# Patient Record
Sex: Female | Born: 1965 | Race: Black or African American | Hispanic: No | Marital: Single | Smoking: Never smoker
Health system: Southern US, Community
[De-identification: ages and names within clinical notes are randomized; demographics above are authoritative.]

## PROBLEM LIST (undated history)

## (undated) DIAGNOSIS — M1990 Unspecified osteoarthritis, unspecified site: Secondary | ICD-10-CM

## (undated) DIAGNOSIS — J45909 Unspecified asthma, uncomplicated: Secondary | ICD-10-CM

## (undated) DIAGNOSIS — R112 Nausea with vomiting, unspecified: Secondary | ICD-10-CM

## (undated) DIAGNOSIS — I10 Essential (primary) hypertension: Secondary | ICD-10-CM

## (undated) DIAGNOSIS — Z9889 Other specified postprocedural states: Secondary | ICD-10-CM

## (undated) HISTORY — PX: TUBAL LIGATION: SHX77

## (undated) HISTORY — PX: CHOLECYSTECTOMY: SHX55

## (undated) HISTORY — PX: ABDOMINAL HYSTERECTOMY: SHX81

## (undated) HISTORY — PX: OTHER SURGICAL HISTORY: SHX169

---

## 2013-04-08 ENCOUNTER — Emergency Department (HOSPITAL_COMMUNITY)
Admission: EM | Admit: 2013-04-08 | Discharge: 2013-04-08 | Disposition: A | Discharge status: Home / Self Care | Payer: BC Managed Care – PPO | Attending: Emergency Medicine | Admitting: Emergency Medicine

## 2013-04-08 ENCOUNTER — Encounter (HOSPITAL_COMMUNITY): Payer: Self-pay

## 2013-04-08 ENCOUNTER — Emergency Department (HOSPITAL_COMMUNITY): Payer: BC Managed Care – PPO

## 2013-04-08 DIAGNOSIS — Z79899 Other long term (current) drug therapy: Secondary | ICD-10-CM | POA: Insufficient documentation

## 2013-04-08 DIAGNOSIS — Z91040 Latex allergy status: Secondary | ICD-10-CM | POA: Insufficient documentation

## 2013-04-08 DIAGNOSIS — I10 Essential (primary) hypertension: Secondary | ICD-10-CM

## 2013-04-08 DIAGNOSIS — R51 Headache: Secondary | ICD-10-CM | POA: Insufficient documentation

## 2013-04-08 HISTORY — DX: Essential (primary) hypertension: I10

## 2013-04-08 LAB — CBC WITH DIFFERENTIAL/PLATELET
Basophils Absolute: 0 10*3/uL (ref 0.0–0.1)
Eosinophils Absolute: 0.1 10*3/uL (ref 0.0–0.7)
Eosinophils Relative: 3 % (ref 0–5)
MCH: 29.7 pg (ref 26.0–34.0)
MCV: 82.2 fL (ref 78.0–100.0)
Platelets: 294 10*3/uL (ref 150–400)
RDW: 12.6 % (ref 11.5–15.5)

## 2013-04-08 LAB — BASIC METABOLIC PANEL
Calcium: 10 mg/dL (ref 8.4–10.5)
Creatinine, Ser: 0.64 mg/dL (ref 0.50–1.10)
GFR calc non Af Amer: >90 mL/min (ref >90)
Glucose, Bld: 105 mg/dL — ABNORMAL HIGH (ref 70–99)
Sodium: 141 mEq/L (ref 135–145)

## 2013-04-08 LAB — POCT I-STAT TROPONIN I: Troponin i, poc: 0.01 ng/mL (ref 0.00–0.08)

## 2013-04-08 MED ORDER — ONDANSETRON HCL 4 MG/2ML IJ SOLN
INTRAMUSCULAR | Status: AC
  Administered 2013-04-08: 4 mg
  Filled 2013-04-08: qty 2

## 2013-04-08 MED ORDER — HYDROCHLOROTHIAZIDE 25 MG PO TABS: 25.0000 mg | ORAL_TABLET | Freq: Every day | ORAL | Status: DC

## 2013-04-08 NOTE — ED Notes (Addendum)
Per [facility], pt was at work and started having a HA. Pt has been out of her BP med, HCTZ, for a few weeks. BP 220/110 then 183/120. NSR on the monitor. 18g to RAC. Was given her dose of HCTZ at work.

## 2013-04-08 NOTE — ED Notes (Signed)
Pt states she vomited while using the rest room.

## 2013-04-08 NOTE — ED Provider Notes (Signed)
History     CSN: [id number]  Arrival date & time 04/08/13  5784   First MD Initiated Contact with Patient 04/08/13 [phone]      Chief Complaint  Patient presents with  . Hypertension    (Consider location/radiation/quality/duration/timing/severity/associated sxs/prior treatment) HPI Comments: Patient is a 47 year old female with a past medical history of hypertension who presents with a headache that started when the patient was at work this morning. Patient reports being out of her HCTZ for the past 3 weeks. She thinks her headache may be due to her hypertension. Patient reports having her BP checked at work, which was 220/110 at the time. Her headache was aching and severe. No aggravating/alleviating factors. No associated symptoms.    Past Medical History  Diagnosis Date  . Hypertension     Past Surgical History  Procedure Laterality Date  . Tubal ligation    . Abdominal hysterectomy      No family history on file.  History  Substance Use Topics  . Smoking status: Never Smoker   . Smokeless tobacco: Not on file  . Alcohol Use: Yes     Comment: occassionally    OB History   Grav Para Term Preterm Abortions TAB SAB Ect Mult Living                  Review of Systems  Constitutional:       Hypertension  All other systems reviewed and are negative.    Allergies  Latex  Home Medications   Current Outpatient Rx  Name  Route  Sig  Dispense  Refill  . hydrochlorothiazide (HYDRODIURIL) 25 MG tablet   Oral   Take 25 mg by mouth daily.         Marland Kitchen ibuprofen (ADVIL,MOTRIN) 200 MG tablet   Oral   Take 800 mg by mouth every 6 (six) hours as needed for pain.           BP 151/94  Pulse 55  Temp(Src) 98.3 F (36.8 C) (Oral)  Resp 11  SpO2 89%  Physical Exam  Nursing note and vitals reviewed. Constitutional: She is oriented to person, place, and time. She appears well-developed and well-nourished. No distress.  HENT:  Head: Normocephalic and atraumatic.   Eyes: Conjunctivae and EOM are normal. Pupils are equal, round, and reactive to light.  Neck: Normal range of motion. Neck supple.  Cardiovascular: Normal rate and regular rhythm.  Exam reveals no gallop and no friction rub.   No murmur heard. Pulmonary/Chest: Effort normal and breath sounds normal. She has no wheezes. She has no rales. She exhibits no tenderness.  Abdominal: Soft. She exhibits no distension. There is no tenderness. There is no rebound and no guarding.  Musculoskeletal: Normal range of motion.  Neurological: She is alert and oriented to person, place, and time. Coordination normal.  Speech is goal-oriented. Moves limbs without ataxia.   Skin: Skin is warm and dry.  Psychiatric: She has a normal mood and affect. Her behavior is normal.    ED Course  Procedures (including critical care time)   Date: 04/08/2013  Rate: 59  Rhythm: normal sinus rhythm  QRS Axis: normal  Intervals: normal  ST/T Wave abnormalities: normal  Conduction Disutrbances:none  Narrative Interpretation: NSR  Old EKG Reviewed: none available    Labs Reviewed  CBC WITH DIFFERENTIAL - Abnormal; Notable for the following:    MCHC 36.1 (*)    All other components within normal limits  BASIC METABOLIC PANEL -  Abnormal; Notable for the following:    Potassium 3.2 (*)    Glucose, Bld 105 (*)    All other components within normal limits  POCT I-STAT TROPONIN I   Dg Chest 2 View  04/08/2013  *RADIOLOGY REPORT*  Clinical Data: Left-sided chest pain.  CHEST - 2 VIEW  Comparison: None.  Findings: The heart size is normal.  The lungs are clear.  The visualized soft tissues and bony thorax are unremarkable.  IMPRESSION: Negative two-view chest.   Original Report Authenticated By: Marin Roberts, M.D.      1. Hypertension       MDM  12:03 PM Labs unremarkable.   1:47 PM Chest xray unremarkable. Patient's BP is improving since she took her HCTZ that was given to her at work. Vitals stable  and patient is afebrile. Patient will be discharged without further evaluation. I will refill her HCTZ.        Emilia Beck, PA-C 04/08/13 1356

## 2013-04-08 NOTE — ED Notes (Addendum)
Pt starting to have cp on left side of chest. PA made aware

## 2013-04-08 NOTE — ED Notes (Signed)
Kaitlyn, PA back in with patient.

## 2013-04-09 NOTE — ED Provider Notes (Signed)
Medical screening examination/treatment/procedure(s) were performed by non-physician practitioner and as supervising physician I was immediately available for consultation/collaboration.   Shantale Holtmeyer L Brie Eppard, MD 04/09/13 1148 

## 2015-10-24 ENCOUNTER — Emergency Department (HOSPITAL_COMMUNITY): Payer: BLUE CROSS/BLUE SHIELD

## 2015-10-24 ENCOUNTER — Encounter (HOSPITAL_COMMUNITY): Payer: Self-pay | Admitting: Emergency Medicine

## 2015-10-24 ENCOUNTER — Inpatient Hospital Stay (HOSPITAL_COMMUNITY)
Admission: EM | Admit: 2015-10-24 | Discharge: 2015-10-28 | DRG: 203 | Disposition: A | Discharge status: Home / Self Care | Payer: BLUE CROSS/BLUE SHIELD | Attending: Internal Medicine | Admitting: Internal Medicine

## 2015-10-24 DIAGNOSIS — D72828 Other elevated white blood cell count: Secondary | ICD-10-CM | POA: Diagnosis present

## 2015-10-24 DIAGNOSIS — R000 Tachycardia, unspecified: Secondary | ICD-10-CM | POA: Diagnosis present

## 2015-10-24 DIAGNOSIS — Z91040 Latex allergy status: Secondary | ICD-10-CM

## 2015-10-24 DIAGNOSIS — J209 Acute bronchitis, unspecified: Secondary | ICD-10-CM | POA: Diagnosis present

## 2015-10-24 DIAGNOSIS — E876 Hypokalemia: Secondary | ICD-10-CM | POA: Diagnosis present

## 2015-10-24 DIAGNOSIS — I10 Essential (primary) hypertension: Secondary | ICD-10-CM | POA: Diagnosis present

## 2015-10-24 DIAGNOSIS — Z888 Allergy status to other drugs, medicaments and biological substances status: Secondary | ICD-10-CM

## 2015-10-24 DIAGNOSIS — R0602 Shortness of breath: Secondary | ICD-10-CM

## 2015-10-24 DIAGNOSIS — D72829 Elevated white blood cell count, unspecified: Secondary | ICD-10-CM | POA: Diagnosis not present

## 2015-10-24 DIAGNOSIS — T380X5A Adverse effect of glucocorticoids and synthetic analogues, initial encounter: Secondary | ICD-10-CM | POA: Diagnosis present

## 2015-10-24 DIAGNOSIS — J029 Acute pharyngitis, unspecified: Secondary | ICD-10-CM | POA: Diagnosis present

## 2015-10-24 DIAGNOSIS — J40 Bronchitis, not specified as acute or chronic: Secondary | ICD-10-CM

## 2015-10-24 HISTORY — DX: Essential (primary) hypertension: I10

## 2015-10-24 LAB — BASIC METABOLIC PANEL
ANION GAP: 10 (ref 5–15)
Anion gap: 16 — ABNORMAL HIGH (ref 5–15)
BUN: 13 mg/dL (ref 6–20)
BUN: 10 mg/dL (ref 6–20)
CALCIUM: 9.9 mg/dL (ref 8.9–10.3)
CO2: 27 mmol/L (ref 22–32)
CO2: 18 mmol/L — AB (ref 22–32)
CREATININE: 0.88 mg/dL (ref 0.44–1.00)
Calcium: 9 mg/dL (ref 8.9–10.3)
Chloride: 102 mmol/L (ref 101–111)
Chloride: 106 mmol/L (ref 101–111)
Creatinine, Ser: 0.84 mg/dL (ref 0.44–1.00)
GFR calc Af Amer: >60 mL/min (ref >60)
GFR calc non Af Amer: >60 mL/min (ref >60)
GFR calc non Af Amer: >60 mL/min (ref >60)
GLUCOSE: 216 mg/dL — AB (ref 65–99)
Glucose, Bld: 130 mg/dL — ABNORMAL HIGH (ref 65–99)
Potassium: <2 mmol/L — CL (ref 3.5–5.1)
Potassium: 2.5 mmol/L — CL (ref 3.5–5.1)
Sodium: 139 mmol/L (ref 135–145)
Sodium: 140 mmol/L (ref 135–145)

## 2015-10-24 LAB — CBC WITH DIFFERENTIAL/PLATELET
BASOS ABS: 0 10*3/uL (ref 0.0–0.1)
BASOS PCT: 0 %
EOS ABS: 0.2 10*3/uL (ref 0.0–0.7)
EOS PCT: 2 %
HCT: 35.9 % — ABNORMAL LOW (ref 36.0–46.0)
Hemoglobin: 12.6 g/dL (ref 12.0–15.0)
LYMPHS PCT: 46 %
Lymphs Abs: 3.5 10*3/uL (ref 0.7–4.0)
MCH: 29 pg (ref 26.0–34.0)
MCHC: 35.1 g/dL (ref 30.0–36.0)
MCV: 82.7 fL (ref 78.0–100.0)
MONO ABS: 0.4 10*3/uL (ref 0.1–1.0)
Monocytes Relative: 5 %
Neutro Abs: 3.6 10*3/uL (ref 1.7–7.7)
Neutrophils Relative %: 47 %
PLATELETS: 367 10*3/uL (ref 150–400)
RBC: 4.34 MIL/uL (ref 3.87–5.11)
RDW: 12.9 % (ref 11.5–15.5)
WBC: 7.7 10*3/uL (ref 4.0–10.5)

## 2015-10-24 LAB — BLOOD GAS, ARTERIAL
ACID-BASE DEFICIT: 4.5 mmol/L — AB (ref 0.0–2.0)
Bicarbonate: 18.2 mEq/L — ABNORMAL LOW (ref 20.0–24.0)
DRAWN BY: 307971
FIO2: 0.21
O2 SAT: 96.3 %
PATIENT TEMPERATURE: 98.6
PCO2 ART: 27.5 mmHg — AB (ref 35.0–45.0)
TCO2: 16.3 mmol/L (ref 0–100)
pH, Arterial: 7.436 (ref 7.350–7.450)
pO2, Arterial: 86.2 mmHg (ref 80.0–100.0)

## 2015-10-24 LAB — INFLUENZA PANEL BY PCR (TYPE A & B)
H1N1 flu by pcr: NOT DETECTED
INFLAPCR: NEGATIVE
INFLBPCR: NEGATIVE

## 2015-10-24 LAB — I-STAT TROPONIN, ED: TROPONIN I, POC: 0 ng/mL (ref 0.00–0.08)

## 2015-10-24 LAB — MAGNESIUM: Magnesium: 1.7 mg/dL (ref 1.7–2.4)

## 2015-10-24 MED ORDER — GUAIFENESIN ER 600 MG PO TB12
1200.0000 mg | ORAL_TABLET | Freq: Two times a day (BID) | ORAL | Status: DC
  Administered 2015-10-24 – 2015-10-26 (×4): 1200 mg via ORAL
  Filled 2015-10-24 (×4): qty 2

## 2015-10-24 MED ORDER — IPRATROPIUM BROMIDE 0.02 % IN SOLN
0.5000 mg | Freq: Four times a day (QID) | RESPIRATORY_TRACT | Status: DC
  Administered 2015-10-24 – 2015-10-28 (×15): 0.5 mg via RESPIRATORY_TRACT
  Filled 2015-10-24 (×15): qty 2.5

## 2015-10-24 MED ORDER — POTASSIUM CHLORIDE CRYS ER 20 MEQ PO TBCR
40.0000 meq | EXTENDED_RELEASE_TABLET | ORAL | Status: AC
  Administered 2015-10-24 (×3): 40 meq via ORAL
  Filled 2015-10-24 (×3): qty 2

## 2015-10-24 MED ORDER — ENOXAPARIN SODIUM 40 MG/0.4ML ~~LOC~~ SOLN
40.0000 mg | SUBCUTANEOUS | Status: DC
  Administered 2015-10-24 – 2015-10-27 (×4): 40 mg via SUBCUTANEOUS
  Filled 2015-10-24 (×4): qty 0.4

## 2015-10-24 MED ORDER — METHYLPREDNISOLONE SODIUM SUCC 125 MG IJ SOLR
80.0000 mg | Freq: Four times a day (QID) | INTRAMUSCULAR | Status: DC
  Administered 2015-10-24 – 2015-10-25 (×3): 80 mg via INTRAVENOUS
  Filled 2015-10-24 (×3): qty 2

## 2015-10-24 MED ORDER — HYDROXYZINE HCL 25 MG PO TABS: 25.0000 mg | ORAL_TABLET | Freq: Three times a day (TID) | ORAL | Status: DC | PRN

## 2015-10-24 MED ORDER — ACETAMINOPHEN 325 MG PO TABS: 650.0000 mg | ORAL_TABLET | Freq: Four times a day (QID) | ORAL | Status: DC | PRN

## 2015-10-24 MED ORDER — ONDANSETRON HCL 4 MG/2ML IJ SOLN: 4.0000 mg | Freq: Four times a day (QID) | INTRAMUSCULAR | Status: DC | PRN

## 2015-10-24 MED ORDER — ALUM & MAG HYDROXIDE-SIMETH 200-200-20 MG/5ML PO SUSP: 30.0000 mL | Freq: Four times a day (QID) | ORAL | Status: DC | PRN

## 2015-10-24 MED ORDER — METHYLPREDNISOLONE SODIUM SUCC 125 MG IJ SOLR
125.0000 mg | Freq: Once | INTRAMUSCULAR | Status: AC
  Administered 2015-10-24: 125 mg via INTRAVENOUS
  Filled 2015-10-24: qty 2

## 2015-10-24 MED ORDER — IPRATROPIUM BROMIDE 0.02 % IN SOLN
0.5000 mg | Freq: Once | RESPIRATORY_TRACT | Status: AC
  Administered 2015-10-24: 0.5 mg via RESPIRATORY_TRACT
  Filled 2015-10-24: qty 2.5

## 2015-10-24 MED ORDER — SORBITOL 70 % SOLN
30.0000 mL | Freq: Every day | Status: DC | PRN
Start: 2015-10-24 — End: 2015-10-28

## 2015-10-24 MED ORDER — ACETAMINOPHEN 650 MG RE SUPP
650.0000 mg | Freq: Four times a day (QID) | RECTAL | Status: DC | PRN
Start: 2015-10-24 — End: 2015-10-28

## 2015-10-24 MED ORDER — GUAIFENESIN-DM 100-10 MG/5ML PO SYRP
5.0000 mL | ORAL_SOLUTION | ORAL | Status: DC | PRN
  Administered 2015-10-24 – 2015-10-26 (×5): 5 mL via ORAL
  Filled 2015-10-24 (×5): qty 10

## 2015-10-24 MED ORDER — POTASSIUM CHLORIDE CRYS ER 20 MEQ PO TBCR
40.0000 meq | EXTENDED_RELEASE_TABLET | ORAL | Status: AC
  Administered 2015-10-25: 40 meq via ORAL
  Filled 2015-10-24 (×2): qty 2

## 2015-10-24 MED ORDER — SODIUM CHLORIDE 0.9 % IJ SOLN
3.0000 mL | Freq: Two times a day (BID) | INTRAMUSCULAR | Status: DC
  Administered 2015-10-25 – 2015-10-26 (×2): 3 mL via INTRAVENOUS

## 2015-10-24 MED ORDER — SENNOSIDES-DOCUSATE SODIUM 8.6-50 MG PO TABS: 1.0000 | ORAL_TABLET | Freq: Every evening | ORAL | Status: DC | PRN

## 2015-10-24 MED ORDER — MAGNESIUM SULFATE 4 GM/100ML IV SOLN
4.0000 g | Freq: Once | INTRAVENOUS | Status: AC
  Administered 2015-10-24: 4 g via INTRAVENOUS
  Filled 2015-10-24: qty 100

## 2015-10-24 MED ORDER — SODIUM CHLORIDE 0.9 % IV BOLUS (SEPSIS)
1000.0000 mL | Freq: Once | INTRAVENOUS | Status: AC
  Administered 2015-10-24: 1000 mL via INTRAVENOUS

## 2015-10-24 MED ORDER — IPRATROPIUM BROMIDE 0.02 % IN SOLN: 0.5000 mg | RESPIRATORY_TRACT | Status: DC | PRN

## 2015-10-24 MED ORDER — SODIUM CHLORIDE 0.9 % IV SOLN
INTRAVENOUS | Status: DC
  Administered 2015-10-24 – 2015-10-25 (×2): via INTRAVENOUS
  Filled 2015-10-24 (×4): qty 1000

## 2015-10-24 MED ORDER — LEVALBUTEROL HCL 0.63 MG/3ML IN NEBU: 0.6300 mg | INHALATION_SOLUTION | RESPIRATORY_TRACT | Status: DC | PRN

## 2015-10-24 MED ORDER — OXYCODONE HCL 5 MG PO TABS
5.0000 mg | ORAL_TABLET | ORAL | Status: DC | PRN
  Administered 2015-10-25 – 2015-10-27 (×5): 5 mg via ORAL
  Filled 2015-10-24 (×5): qty 1

## 2015-10-24 MED ORDER — ALBUTEROL SULFATE (2.5 MG/3ML) 0.083% IN NEBU
5.0000 mg | INHALATION_SOLUTION | Freq: Once | RESPIRATORY_TRACT | Status: AC
  Administered 2015-10-24: 5 mg via RESPIRATORY_TRACT
  Filled 2015-10-24: qty 6

## 2015-10-24 MED ORDER — POTASSIUM CHLORIDE CRYS ER 20 MEQ PO TBCR
40.0000 meq | EXTENDED_RELEASE_TABLET | Freq: Once | ORAL | Status: AC
  Administered 2015-10-24: 40 meq via ORAL
  Filled 2015-10-24: qty 2

## 2015-10-24 MED ORDER — LEVOFLOXACIN IN D5W 500 MG/100ML IV SOLN
500.0000 mg | INTRAVENOUS | Status: DC
  Administered 2015-10-24 – 2015-10-26 (×3): 500 mg via INTRAVENOUS
  Filled 2015-10-24 (×3): qty 100

## 2015-10-24 MED ORDER — OSELTAMIVIR PHOSPHATE 75 MG PO CAPS
75.0000 mg | ORAL_CAPSULE | Freq: Two times a day (BID) | ORAL | Status: DC
  Administered 2015-10-24 – 2015-10-25 (×2): 75 mg via ORAL
  Filled 2015-10-24 (×3): qty 1

## 2015-10-24 MED ORDER — AMLODIPINE BESYLATE 5 MG PO TABS
5.0000 mg | ORAL_TABLET | Freq: Every day | ORAL | Status: DC
Start: 2015-10-25 — End: 2015-10-28
  Administered 2015-10-25 – 2015-10-28 (×4): 5 mg via ORAL
  Filled 2015-10-24 (×4): qty 1

## 2015-10-24 MED ORDER — POTASSIUM CHLORIDE CRYS ER 20 MEQ PO TBCR: 40.0000 meq | EXTENDED_RELEASE_TABLET | ORAL | Status: DC

## 2015-10-24 MED ORDER — LEVALBUTEROL HCL 0.63 MG/3ML IN NEBU
0.6300 mg | INHALATION_SOLUTION | Freq: Four times a day (QID) | RESPIRATORY_TRACT | Status: DC
  Administered 2015-10-24 – 2015-10-28 (×15): 0.63 mg via RESPIRATORY_TRACT
  Filled 2015-10-24 (×15): qty 3

## 2015-10-24 MED ORDER — ALBUTEROL (5 MG/ML) CONTINUOUS INHALATION SOLN
10.0000 mg/h | INHALATION_SOLUTION | RESPIRATORY_TRACT | Status: DC
  Administered 2015-10-24: 10 mg/h via RESPIRATORY_TRACT
  Filled 2015-10-24: qty 20

## 2015-10-24 MED ORDER — POTASSIUM CHLORIDE 10 MEQ/100ML IV SOLN
10.0000 meq | INTRAVENOUS | Status: AC
  Administered 2015-10-24 (×2): 10 meq via INTRAVENOUS
  Filled 2015-10-24 (×2): qty 100

## 2015-10-24 MED ORDER — ONDANSETRON HCL 4 MG PO TABS: 4.0000 mg | ORAL_TABLET | Freq: Four times a day (QID) | ORAL | Status: DC | PRN

## 2015-10-24 MED ORDER — MAGNESIUM SULFATE 2 GM/50ML IV SOLN
2.0000 g | Freq: Once | INTRAVENOUS | Status: DC
  Filled 2015-10-24: qty 50

## 2015-10-24 MED ORDER — HYDROXYZINE HCL 50 MG/ML IM SOLN: 25.0000 mg | Freq: Four times a day (QID) | INTRAMUSCULAR | Status: DC | PRN

## 2015-10-24 NOTE — ED Notes (Signed)
Notified PA and edp results from [facility] 8

## 2015-10-24 NOTE — H&P (Addendum)
[facility] History and Physical  Madilyne Tadlock ZOX:[id number] DOB: 1966-11-17 DOA: 10/24/2015  Referring physician: [facility]  PCP: No PCP Per Patient   Chief Complaint: SOB  HPI: Codie Hainer is a 49 y.o. 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 Patient denies any fevers, no diarrhea, no constipation, no weakness, no melena, no myalgias, no hematemesis, no hematochezia. Patient stated that did not get the flu shot as she did have a reaction to it before in the past. Patient denies any recent long car ride, no recent surgeries. Patient's niece stated that patient recently started taking a diet pill which patient can't quite remember the name of the pill.  Patient was seen in the ED and noted to have worsening shortness of breath, inspiratory and expiratory wheezing, difficulty speaking, with a hoarse voice. Patient was given some continuous nebulizer treatments and IV steroids with slight improvement in his symptoms. Chest x-ray done was negative for any acute infiltrate. Basic metabolic profile done had a potassium of less than 2. Magnesium was 1.7. CBC was unremarkable. EKG showed a sinus tachycardia. [facility] were called to admit the patient for further evaluation and management.   Review of Systems: As per history of present illness otherwise negative. Constitutional:  No weight loss, night sweats, Fevers, chills, fatigue.  HEENT:  No headaches, Difficulty swallowing,Tooth/dental problems,Sore throat,  No sneezing, itching, ear ache, nasal congestion, post nasal drip,  Cardio-vascular:  No chest pain, Orthopnea, PND, swelling in lower extremities,  anasarca, dizziness, palpitations  GI:  No heartburn, indigestion, abdominal pain, nausea, vomiting, diarrhea, change in bowel habits, loss of appetite  Resp:  No shortness of breath with exertion or at rest. No excess mucus, no productive cough, No non-productive cough, No coughing up of blood.No change in color of mucus.No wheezing.No chest wall deformity  Skin:  no rash or lesions.  GU:  no dysuria, change in color of urine, no urgency or frequency. No flank pain.  Musculoskeletal:  No joint pain or swelling. No decreased range of motion. No back pain.  Psych:  No change in mood or affect. No depression or anxiety. No memory loss.   Past Medical History  Diagnosis Date  . Hypertension   . HTN (hypertension) 10/24/2015   Past Surgical History  Procedure Laterality Date  . Tubal ligation    . Abdominal hysterectomy     Social History:  reports that she has never smoked. She does not have any smokeless tobacco history on file. She reports that she drinks alcohol. She reports that she does not use illicit drugs.  Allergies  Allergen Reactions  . Ace Inhibitors Cough  . Latex Rash    History reviewed. No pertinent family history. mother deceased when patient was 56 months old and a such cause of death unknown. Father deceased when patient was 5 years old and patient thinks he may have died from lung cancer.  Prior to Admission medications   Medication Sig Start Date End Date Taking? Authorizing Provider  amLODipine (NORVASC) 5 MG tablet Take 5 mg by mouth daily. 10/14/15  Yes Historical Provider, MD  azithromycin (ZITHROMAX) 250 MG tablet Take 250-500 mg by mouth daily. Take 2 tablets immediately, and 1 tablet daily  for 5 days. 10/18/15  Yes Historical Provider, MD  ibuprofen (ADVIL,MOTRIN) 200 MG tablet Take 800 mg by mouth every 6 (six) hours as needed for pain.   Yes Historical Provider, MD  losartan-hydrochlorothiazide (HYZAAR) 100-25 MG tablet Take 1 tablet by mouth daily.  10/14/15  Yes Historical Provider, MD  Multiple Vitamins-Minerals (MULTIVITAMIN ADULT) TABS Take 1 tablet by mouth daily.   Yes Historical Provider, MD  vitamin E (VITAMIN E) 400 UNIT capsule Take 400 Units by mouth daily.   Yes Historical Provider, MD   Physical Exam: Filed Vitals:   10/24/15 1200 10/24/15 1230 10/24/15 1231 10/24/15 1359  BP: 147/90   125/76  Pulse: 90 98  105  Temp:      TempSrc:      Resp: 20 22  24   Height:      Weight:      SpO2: 100% 100% 99% 100%    Wt Readings from Last 3 Encounters:  10/24/15 78.019 kg (172 lb)    General:  Well-developed well-nourished laying on gurney speaking in short choppy sentences in no acute cardiopulmonary distress. Voice is hoarse. Eyes: PERRLA, EOMI, normal lids, irises & conjunctiva ENT: grossly normal hearing, lips & tongue Neck: no LAD, masses or thyromegaly Cardiovascular: tachycardic, no m/r/g. No LE edema. Telemetry:  sinus tachycardia  Respiratory:  diffuse coarse breath sounds with expiratory wheezing. Speaking in short choppy sentences. Poor air movement. No crackles.  Abdomen: soft, ntnd, positive bowel sounds, no rebound, no guarding  Skin: no rash or induration seen on limited exam Musculoskeletal: grossly normal tone BUE/BLE Psychiatric: grossly normal mood and affect, speech fluent and appropriate Neurologic:  alert and oriented 3. Cranial nerves II through XII are grossly intact. No focal deficits. Sensation is intact. Visual fields are intact. Gait not tested secondary to safety.           Labs on Admission:  Basic Metabolic Panel:  Recent Labs Lab 10/24/15 1214  NA 139  K <2.0*  CL 102  CO2 27  GLUCOSE 130*  BUN 13  CREATININE 0.84  CALCIUM 9.9  MG 1.7   Liver Function Tests: No results for input(s): AST, ALT, ALKPHOS, BILITOT, PROT, ALBUMIN in the last 168 hours. No results for input(s): LIPASE, AMYLASE in the last 168 hours. No results for input(s): AMMONIA in the last 168  hours. CBC:  Recent Labs Lab 10/24/15 1157  WBC 7.7  NEUTROABS 3.6  HGB 12.6  HCT 35.9*  MCV 82.7  PLT 367   Cardiac Enzymes: No results for input(s): CKTOTAL, CKMB, CKMBINDEX, TROPONINI in the last 168 hours.  BNP (last 3 results) No results for input(s): BNP in the last 8760 hours.  ProBNP (last 3 results) No results for input(s): PROBNP in the last 8760 hours.  CBG: No results for input(s): GLUCAP in the last 168 hours.  Radiological Exams on Admission: Dg Chest 2 View  10/24/2015  CLINICAL DATA:  Shortness of breath beginning this morning. Productive cough. Patient diagnosed with bronchitis 10/18/2015. Initial encounter. EXAM: CHEST  2 VIEW COMPARISON:  PA and lateral chest 04/08/2013. FINDINGS: Heart size and mediastinal contours are within normal limits. Both lungs are clear. Visualized skeletal structures are unremarkable. IMPRESSION: Negative exam. Electronically Signed   By: Drusilla Kannerhomas  Dalessio M.D.   On: 10/24/2015 13:26    EKG: Independently reviewed. sinus tachycardia.  Assessment/Plan Principal Problem:   Acute bronchitis with bronchospasm Active Problems:   Acute hypokalemia   HTN (hypertension)   Sinus tachycardia (HCC)  Hypokalemia   #1 acute bronchitis with bronchospasms patient presenting with upper respiratory symptoms with a productive cough with wheezing and worsening shortness of breath. Patient recently diagnosed with a bronchitis. Chest x-ray was negative for any acute infiltrate. Repeat chest x-ray in the morning. Check Influenza PCR. Empiric Tamiflu. Will admit patient. Placed on IV steroids, IV Levaquin, oxygen, scheduled nebulizers, Mucinex.  #2 acute severe hypokalemia Questionable etiology. Patient on diuretics. Patient also noted to have been receiving albuterol. Patient also noted to have recently started a diet pill which she can't quite remember the name of the pill. Magnesium level was at 1.7. Will give a dose of IV magnesium to keep  magnesium greater than 2. We'll place on IV potassium runs, oral potassium, IV fluids with potassium. Serial B meds. Follow.  #3 sinus tachycardia Likely secondary to problem #1 and albuterol. Patient with no significant chest pain. EKG with no ischemic changes. Check a TSH. Place on IV fluids. Treat for problems #1 and 2.  #4 hypertension Continue home regimen Norvasc. Hold Hyzaar for now. If better blood pressure control is needed may increase Norvasc and start losartan.  #5 prophylaxis Lovenox for DVT prophylaxis.   Code Status: Full DVT Prophylaxis: Lovenox Family Communication: updated patient and niece at bedside. Disposition Plan: Admit to telemetry.  Time spent: 65 mins  [facility] MD [facility] Pager [phone]

## 2015-10-24 NOTE — ED Notes (Signed)
Pt can go to floor at 14;10 

## 2015-10-24 NOTE — ED Notes (Signed)
PA/RN notified of abnormal lab 

## 2015-10-24 NOTE — ED Notes (Signed)
Respiratory therapy at bedside.

## 2015-10-24 NOTE — ED Notes (Signed)
Respiratory made aware of neb treatment ordered.  

## 2015-10-24 NOTE — ED Provider Notes (Signed)
CSN: [id number]     Arrival date & time 10/24/15  1038 History   First MD Initiated Contact with Patient 10/24/15 1047     Chief Complaint  Patient presents with  . Shortness of Breath     (Consider location/radiation/quality/duration/timing/severity/associated sxs/prior Treatment) HPI Doris Jones is a 49 y.o. female with history of hypertension, presents to emergency department complaining of shortness of breath. Patient states that she has been coughing and having upper respiratory problems for 2 weeks. She was seen by primary care doctor one week ago and diagnosed with bronchitis. She was treated with azithromycin, and she states they gave her 2 shots in the office. She states she has been taking azithromycin but admits to missing "a dose or 2." She denies using any inhalers. She denies using any other medications. States today patient was at work, developed worsening shortness of breath, wheezing, worsening cough, difficulty speaking, was transported to the emergency department. Patient was given 5 mg of albuterol in formalin is of Zofran IV in route. Patient states that the breathing treatment has helped her some but continues to have shortness of breath and difficulty speaking.  Past Medical History  Diagnosis Date  . Hypertension    Past Surgical History  Procedure Laterality Date  . Tubal ligation    . Abdominal hysterectomy     No family history on file. Social History  Substance Use Topics  . Smoking status: Never Smoker   . Smokeless tobacco: None  . Alcohol Use: Yes     Comment: occassionally   OB History    No data available     Review of Systems  Constitutional: Negative for fever and chills.  Respiratory: Positive for cough, chest tightness and shortness of breath.   Cardiovascular: Positive for chest pain. Negative for palpitations and leg swelling.  Gastrointestinal: Positive for nausea. Negative for vomiting, abdominal pain and diarrhea.  Musculoskeletal:  Negative for myalgias, arthralgias, neck pain and neck stiffness.  Skin: Negative for rash.  Neurological: Negative for dizziness, weakness and headaches.  All other systems reviewed and are negative.     Allergies  Ace inhibitors and Latex  Home Medications   Prior to Admission medications   Medication Sig Start Date End Date Taking? Authorizing Provider  amLODipine (NORVASC) 5 MG tablet Take 5 mg by mouth daily. 10/14/15  Yes Historical Provider, MD  azithromycin (ZITHROMAX) 250 MG tablet Take 250-500 mg by mouth daily. Take 2 tablets immediately, and 1 tablet daily for 5 days. 10/18/15  Yes Historical Provider, MD  ibuprofen (ADVIL,MOTRIN) 200 MG tablet Take 800 mg by mouth every 6 (six) hours as needed for pain.   Yes Historical Provider, MD  losartan-hydrochlorothiazide (HYZAAR) 100-25 MG tablet Take 1 tablet by mouth daily. 10/14/15  Yes Historical Provider, MD  Multiple Vitamins-Minerals (MULTIVITAMIN ADULT) TABS Take 1 tablet by mouth daily.   Yes Historical Provider, MD  vitamin E (VITAMIN E) 400 UNIT capsule Take 400 Units by mouth daily.   Yes Historical Provider, MD  hydrochlorothiazide (HYDRODIURIL) 25 MG tablet Take 1 tablet (25 mg total) by mouth daily. Patient not taking: Reported on 10/24/2015 04/08/13   Kaitlyn Szekalski, PA-C   BP 167/86 mmHg  Pulse 101  Temp(Src) 97.7 F (36.5 C) (Oral)  Resp 17  Ht  (1.626 m)  Wt 172 lb (78.019 kg)  BMI 29.51 kg/m2  SpO2 100% Physical Exam  Constitutional: She is oriented to person, place, and time. She appears well-developed and well-nourished. No distress.  HENT:  Head: Normocephalic.  Eyes: Conjunctivae are normal.  Neck: Neck supple.  Cardiovascular: Normal rate, regular rhythm and normal heart sounds.   Pulmonary/Chest: Effort normal. She has wheezes. She has no rales.  Inspiratory and expiratory wheezes. Patient's voice is hoarse. She is tachypneic. Speaking 3-4 word sentences and time.  Abdominal: Soft. Bowel  sounds are normal. She exhibits no distension. There is no tenderness. There is no rebound.  Musculoskeletal: She exhibits no edema.  Neurological: She is alert and oriented to person, place, and time.  Skin: Skin is warm and dry.  Psychiatric: She has a normal mood and affect. Her behavior is normal.  Nursing note and vitals reviewed.   ED Course  Procedures (including critical care time) Labs Review Labs Reviewed  CBC WITH DIFFERENTIAL/PLATELET - Abnormal; Notable for the following:    HCT 35.9 (*)    All other components within normal limits  BASIC METABOLIC PANEL - Abnormal; Notable for the following:    Potassium <2.0 (*)    Glucose, Bld 130 (*)    All other components within normal limits  BLOOD GAS, ARTERIAL - Abnormal; Notable for the following:    pCO2 arterial 27.5 (*)    Bicarbonate 18.2 (*)    Acid-base deficit 4.5 (*)    All other components within normal limits  URINE CULTURE  MAGNESIUM  INFLUENZA PANEL BY PCR (TYPE A & B, H1N1)  TSH  URINALYSIS, ROUTINE W REFLEX MICROSCOPIC (NOT AT [facility])  BASIC METABOLIC PANEL  CBC  I-STAT CHEM 8, ED  I-STAT TROPOININ, ED    Imaging Review Dg Chest 2 View  10/24/2015  CLINICAL DATA:  Shortness of breath beginning this morning. Productive cough. Patient diagnosed with bronchitis 10/18/2015. Initial encounter. EXAM: CHEST  2 VIEW COMPARISON:  PA and lateral chest 04/08/2013. FINDINGS: Heart size and mediastinal contours are within normal limits. Both lungs are clear. Visualized skeletal structures are unremarkable. IMPRESSION: Negative exam. Electronically Signed   By: Drusilla Kanner M.D.   On: 10/24/2015 13:26   I have personally reviewed and evaluated these images and lab results as part of my medical decision-making.   EKG Interpretation   Date/Time:  Monday October 24 2015 10:58:29 EST Ventricular Rate:  102 PR Interval:  145 QRS Duration: 90 QT Interval:  392 QTC Calculation: 511 R Axis:   31 Text  Interpretation:  Sinus tachycardia Borderline low voltage, extremity  leads Minimal ST depression, lateral leads Prolonged QT interval , new  since last tracing Since last tracing rate faster Confirmed by KNAPP   MD-J, JON ([id number]) on 10/24/2015 11:13:55 AM      MDM   Final diagnoses:  Hypokalemia  Bronchitis    11:42 AM Pt seen and examined. Pt with cough, wheezing, tachypnea. Will give another breathing tx. CXR ordered. Labs pending. No CP. Will add ecg. Doubt PE, no risk factors.   The patient continues to have shortness of breath after breathing treatment. Will start on continuous neb. Patient's potassium is less than 2. Repeated to verify. Magnesium is normal. Actually on magnesium for her shortness of breath as well. Will give potassium. 40 mEq given by mouth, will run 3 runs of 10 mEq IV. EKG showing mildly prolonged QT, otherwise with no acute findings.  Patient shortness of breath improved. She continues to be tachypneic however ambulated to the bathroom back with no problems. She still coughing. She will need frequent neb treatments and close monitoring. Spoke with tried hospitalist who will admit her, potassium still running.  Filed Vitals:   10/24/15 1359 10/24/15 1400 10/24/15 1445 10/24/15 1645  BP: 125/76 119/63  128/67  Pulse: 105 96 112 113  Temp:    98.4 F (36.9 C)  TempSrc:    Oral  Resp: 24 22 20 20   Height:    5\' 1"  (1.549 m)  Weight:    172 lb 9.9 oz (78.3 kg)  SpO2: 100% 100% 100% 100%       Doris Crumbleatyana Emberlyn Burlison, PA-C 10/24/15 1720  Lavera Guiseana Duo Liu, MD 10/24/15 1912

## 2015-10-24 NOTE — ED Notes (Signed)
Patient transported to X-ray 

## 2015-10-24 NOTE — Progress Notes (Signed)
CRITICAL VALUE ALERT  Critical value received:  K 2.5  Date of notification:  10/24/15  Time of notification:  1930  Critical value read back:Yes.    Nurse who received alert:  Tommy Medalara Dark, RN  MD notified (1st page):  Donnamarie PoagK. Kirby, NP  Time of first page:  1935  MD notified (2nd page):  Time of second page:  Responding MD:  Donnamarie PoagK. Kirby, NP  Time MD responded:  2008

## 2015-10-24 NOTE — ED Notes (Addendum)
Per EMS November 1st: diagnosed with bronchitis; currently on erythromycin Patient at work this morning with shortness of breath; audible wheezing; coughing up yellow sputum; denies pain//diarrhea; complaint of nausea/vomiting in route 5 Albuterol in route; Albuterol with atrovent in route; 4 mg zofran IV in route

## 2015-10-24 NOTE — Progress Notes (Addendum)
Pt confirms pcp is [facility]   [phone] [address] [place] [phone]  [phone]  EPIC updated

## 2015-10-25 ENCOUNTER — Inpatient Hospital Stay (HOSPITAL_COMMUNITY): Payer: BLUE CROSS/BLUE SHIELD

## 2015-10-25 DIAGNOSIS — D72829 Elevated white blood cell count, unspecified: Secondary | ICD-10-CM | POA: Diagnosis not present

## 2015-10-25 LAB — CBC
HCT: 34.5 % — ABNORMAL LOW (ref 36.0–46.0)
HEMOGLOBIN: 11.9 g/dL — AB (ref 12.0–15.0)
MCH: 29.3 pg (ref 26.0–34.0)
MCHC: 34.5 g/dL (ref 30.0–36.0)
MCV: 85 fL (ref 78.0–100.0)
PLATELETS: 351 10*3/uL (ref 150–400)
RBC: 4.06 MIL/uL (ref 3.87–5.11)
RDW: 14 % (ref 11.5–15.5)
WBC: 19.9 10*3/uL — ABNORMAL HIGH (ref 4.0–10.5)

## 2015-10-25 LAB — BASIC METABOLIC PANEL
Anion gap: 6 (ref 5–15)
BUN: 8 mg/dL (ref 6–20)
CALCIUM: 8.8 mg/dL — AB (ref 8.9–10.3)
CO2: 21 mmol/L — AB (ref 22–32)
CREATININE: 0.62 mg/dL (ref 0.44–1.00)
Chloride: 111 mmol/L (ref 101–111)
GFR calc Af Amer: >60 mL/min (ref >60)
GLUCOSE: 154 mg/dL — AB (ref 65–99)
Potassium: 5.2 mmol/L — ABNORMAL HIGH (ref 3.5–5.1)
Sodium: 138 mmol/L (ref 135–145)

## 2015-10-25 LAB — URINALYSIS, ROUTINE W REFLEX MICROSCOPIC
BILIRUBIN URINE: NEGATIVE
GLUCOSE, UA: NEGATIVE mg/dL
HGB URINE DIPSTICK: NEGATIVE
KETONES UR: NEGATIVE mg/dL
LEUKOCYTES UA: NEGATIVE
Nitrite: NEGATIVE
PH: 5.5 (ref 5.0–8.0)
PROTEIN: NEGATIVE mg/dL
Specific Gravity, Urine: 1.012 (ref 1.005–1.030)
Urobilinogen, UA: 0.2 mg/dL (ref 0.0–1.0)

## 2015-10-25 LAB — TSH: TSH: 0.33 u[IU]/mL — AB (ref 0.350–4.500)

## 2015-10-25 MED ORDER — MENTHOL 3 MG MT LOZG
1.0000 | LOZENGE | OROMUCOSAL | Status: DC | PRN
  Administered 2015-10-25: 3 mg via ORAL
  Filled 2015-10-25: qty 9

## 2015-10-25 MED ORDER — CHLORHEXIDINE GLUCONATE CLOTH 2 % EX PADS: 6.0000 | MEDICATED_PAD | Freq: Every day | CUTANEOUS | Status: DC

## 2015-10-25 MED ORDER — FLUTICASONE PROPIONATE 50 MCG/ACT NA SUSP
2.0000 | Freq: Every day | NASAL | Status: DC
  Administered 2015-10-25 – 2015-10-28 (×3): 2 via NASAL
  Filled 2015-10-25: qty 16

## 2015-10-25 MED ORDER — ONDANSETRON HCL 4 MG/2ML IJ SOLN
4.0000 mg | Freq: Four times a day (QID) | INTRAMUSCULAR | Status: DC | PRN
Start: 2015-10-25 — End: 2015-10-28
  Filled 2015-10-25: qty 2

## 2015-10-25 MED ORDER — SODIUM CHLORIDE 0.9 % IV SOLN
INTRAVENOUS | Status: DC
  Administered 2015-10-25 – 2015-10-26 (×2): via INTRAVENOUS
  Filled 2015-10-25: qty 1000

## 2015-10-25 MED ORDER — MUPIROCIN 2 % EX OINT: 1.0000 "application " | TOPICAL_OINTMENT | Freq: Two times a day (BID) | CUTANEOUS | Status: DC

## 2015-10-25 MED ORDER — METHYLPREDNISOLONE SODIUM SUCC 125 MG IJ SOLR
80.0000 mg | Freq: Three times a day (TID) | INTRAMUSCULAR | Status: DC
  Administered 2015-10-25 – 2015-10-26 (×4): 80 mg via INTRAVENOUS
  Filled 2015-10-25 (×4): qty 2

## 2015-10-25 NOTE — Progress Notes (Signed)
PT Cancellation Note  Patient Details Name: Doris Jones MRN: [id number] DOB: Feb 16, 1966   Cancelled Treatment:    Reason Eval/Treat Not Completed: PT screened, no needs identified, will sign off. Pt walked and climbed stairs with OT-Mod Ind. No PT needs. Will sign off.    Rebeca AlertJannie Krist Rosenboom, MPT Pager: [phone]

## 2015-10-25 NOTE — Evaluation (Signed)
  Occupational Therapy Evaluation Patient Details Name: Doris Goresatricia Badami MRN: [id number] DOB: [birth year] Today's Date: 10/25/2015    History of Present Illness Doris Jones is a 49 y.o. 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 She presents to ED with worsening shortness of breath while at work with associated wheezing, cough productive of yellowish phlegm, headache, sore throat, chest pain on coughing, chills, nausea and episode of emesis.   Clinical Impression   Patient admitted with above. Patient independent PTA. Patient currently functioning at an overall mod I level (for increased time).  Pt ambulated around unit and ambulated up flight of stairs with no needed rest breaks and no assistive device. No additional OT needs identified, D/C from acute OT services and no additional follow-up OT needs at this time. All appropriate education provided to patient and family (mother in law). Please re-order OT if needed.      Follow Up Recommendations  No OT follow up;Supervision - Intermittent    Equipment Recommendations  None recommended by OT    Recommendations for Other Services  None at this time    Precautions / Restrictions Precautions Precautions: None Restrictions Weight Bearing Restrictions: No      Mobility Bed Mobility Overal bed mobility: Modified Independent (for increased time)  Transfers Overall transfer level: Modified independent (for increased time)    Balance Overall balance assessment: Independent    ADL Overall ADL's : Modified independent General ADL Comments: Mod I, for increased time.     Pertinent Vitals/Pain Pain Assessment: No/denies pain     Hand Dominance Right   Extremity/Trunk Assessment Upper Extremity Assessment Upper Extremity Assessment: Overall WFL for tasks assessed   Lower Extremity Assessment Lower Extremity Assessment: Defer to PT  evaluation   Cervical / Trunk Assessment Cervical / Trunk Assessment: Normal   Communication Communication Communication: No difficulties   Cognition Arousal/Alertness: Awake/alert Behavior During Therapy: WFL for tasks assessed/performed Overall Cognitive Status: Within Functional Limits for tasks assessed              Home Living Family/patient expects to be discharged to:: Private residence Living Arrangements: Alone (young daughter stays with her)   Type of Home: Apartment Home Access: Stairs to enter Secretary/administratorntrance Stairs-Number of Steps: 14   Home Layout: One level     Bathroom Shower/Tub: Chief Strategy OfficerTub/shower unit   Bathroom Toilet: Standard     Home Equipment: None    Prior Functioning/Environment Level of Independence: Independent      OT Diagnosis: Generalized weakness   OT Problem List:  n/a, no acute OT needs identified    OT Treatment/Interventions:   n/a, no acute OT needs identified    OT Goals(Current goals can be found in the care plan section) Acute Rehab OT Goals Patient Stated Goal: get back to work by this weekend OT Goal Formulation: All assessment and education complete, DC therapy  OT Frequency:  n/a, no acute OT needs identified    Barriers to D/C:  None known at this time    End of Session Nurse Communication: Mobility status  Activity Tolerance: Patient tolerated treatment well Patient left: in bed;with call bell/phone within reach;with family/visitor present   Time: 1403-1420 OT Time Calculation (min): 17 min Charges:  OT General Charges $OT Visit: 1 Procedure OT Evaluation $Initial OT Evaluation Tier I: 1 Procedure  Betti Goodenow,Karlei , MS, OTR/L, CLT Pager: [phone]  10/25/2015, 2:32 PM

## 2015-10-25 NOTE — Progress Notes (Signed)
[facility] PROGRESS NOTE  Gwendlyon Zumbro ZOX:[id number] DOB: January 30, 1966 DOA: 10/24/2015 PCP: [facility]  Assessment/Plan: #1 acute bronchitis with bronchospasms Clinical improvement. Repeat chest x-ray negative for any acute infiltrate. Influenza PCR is negative. Taper IV steroids. Continue IV Levaquin, oxygen, Mucinex, scheduled nebs. Discontinue Tamiflu. Follow.  #2 acute severe hypokalemia Questionable etiology. Patient was on diuretics. Patient was also noted to be taken some weight loss/diet pill which may have contributed to the patient's hypokalemia. Potassium has been repleted. Follow.  #3 sinus tachycardia Improved. Secondary to problem #1 and 2. TSH slightly decreased however will repeat thyroid function studies as outpatient in about 4-6 weeks. Continue IV fluids. Supportive care.  #4 hypertension Continue Norvasc. Hyzaar on hold.  #5 leukocytosis Likely secondary to steroids. Repeat chest x-ray negative. Urinalysis is negative. Patient on empiric IV Levaquin. Follow for now.  6 prophylaxis Lovenox for DVT prophylaxis.  Code Status: Full Family Communication: Updated patient and family at bedside. Disposition Plan:  Home when medically stable.   Consultants:  None  Procedures:  Chest x-ray 10/24/2015, 10/25/2015  Antibiotics:  IV Levaquin 10/24/2015  HPI/Subjective: Patient states feeling better than on admission. Patient still with a cough. Shortness of breath improved.  Objective: Filed Vitals:   10/25/15 1451  BP: 108/61  Pulse: 89  Temp: 97.6 F (36.4 C)  Resp: 20    Intake/Output Summary (Last 24 hours) at 10/25/15 1809 Last data filed at 10/25/15 1452  Gross per 24 hour  Intake 3564.17 ml  Output      0 ml  Net 3564.17 ml   Filed Weights   10/24/15 1052 10/24/15 1645 10/25/15 0536  Weight: 78.019 kg (172 lb) 78.3 kg (172 lb 9.9 oz) 80 kg (176 lb 5.9 oz)    Exam:   General:  NAD  Cardiovascular:  RRR  Respiratory: Coarse rhonchorous breath sounds. Decreased expiratory wheezing.  Abdomen: Soft, nontender, nondistended, positive bowel sounds.  Musculoskeletal: No clubbing cyanosis or edema.  Data Reviewed: Basic Metabolic Panel:  Recent Labs Lab 10/24/15 1214 10/24/15 1835 10/25/15 0444  NA 139 140 138  K <2.0* 2.5* 5.2*  CL 102 106 111  CO2 27 18* 21*  GLUCOSE 130* 216* 154*  BUN CREATININE 0.84 0.88 0.62  CALCIUM 9.9 9.0 8.8*  MG 1.7  --   --    Liver Function Tests: No results for input(s): AST, ALT, ALKPHOS, BILITOT, PROT, ALBUMIN in the last 168 hours. No results for input(s): LIPASE, AMYLASE in the last 168 hours. No results for input(s): AMMONIA in the last 168 hours. CBC:  Recent Labs Lab 10/24/15 1157 10/25/15 0444  WBC 7.7 19.9*  NEUTROABS 3.6  --   HGB 12.6 11.9*  HCT 35.9* 34.5*  MCV 82.7 85.0  PLT 367 351   Cardiac Enzymes: No results for input(s): CKTOTAL, CKMB, CKMBINDEX, TROPONINI in the last 168 hours. BNP (last 3 results) No results for input(s): BNP in the last 8760 hours.  ProBNP (last 3 results) No results for input(s): PROBNP in the last 8760 hours.  CBG: No results for input(s): GLUCAP in the last 168 hours.  No results found for this or any previous visit (from the past 240 hour(s)).   Studies: Dg Chest 2 View  10/25/2015  CLINICAL DATA:  Cough shortness of breath hypertension nonsmoker EXAM: CHEST  2 VIEW COMPARISON:  10/24/2015 FINDINGS: Heart size and vascular pattern normal. No consolidation or effusion. IMPRESSION: No acute findings Electronically Signed   By: Marcy Salvo  Rubner M.D.   On: 10/25/2015 08:40   Dg Chest 2 View  10/24/2015  CLINICAL DATA:  Shortness of breath beginning this morning. Productive cough. Patient diagnosed with bronchitis 10/18/2015. Initial encounter. EXAM: CHEST  2 VIEW COMPARISON:  PA and lateral chest 04/08/2013. FINDINGS: Heart size and mediastinal contours are within normal limits.  Both lungs are clear. Visualized skeletal structures are unremarkable. IMPRESSION: Negative exam. Electronically Signed   By: Drusilla Kannerhomas  Dalessio M.D.   On: 10/24/2015 13:26    Scheduled Meds: . amLODipine  5 mg Oral Daily  . enoxaparin (LOVENOX) injection  40 mg Subcutaneous Q24H  . guaiFENesin  1,200 mg Oral BID  . ipratropium  0.5 mg Nebulization Q6H  . levalbuterol  0.63 mg Nebulization Q6H  . levofloxacin (LEVAQUIN) IV  500 mg Intravenous Q24H  . methylPREDNISolone (SOLU-MEDROL) injection  80 mg Intravenous 3 times per day  . sodium chloride  3 mL Intravenous Q12H   Continuous Infusions: . sodium chloride 0.9 % 1,000 mL infusion 75 mL/hr at 10/25/15 1004    Principal Problem:   Acute bronchitis with bronchospasm Active Problems:   Acute hypokalemia   HTN (hypertension)   Sinus tachycardia (HCC)   Hypokalemia   Leukocytosis    Time spent: 35 minutes    THOMPSON,DANIEL M.D. [facility] Pager [phone]. If 7PM-7AM, please contact night-coverage at [email or web address], password [email or web address] 10/25/2015, 6:09 PM  LOS: 1 day

## 2015-10-26 DIAGNOSIS — I10 Essential (primary) hypertension: Secondary | ICD-10-CM

## 2015-10-26 DIAGNOSIS — J209 Acute bronchitis, unspecified: Principal | ICD-10-CM

## 2015-10-26 LAB — BASIC METABOLIC PANEL
Anion gap: 5 (ref 5–15)
BUN: 16 mg/dL (ref 6–20)
CO2: 24 mmol/L (ref 22–32)
CREATININE: 0.66 mg/dL (ref 0.44–1.00)
Calcium: 9 mg/dL (ref 8.9–10.3)
Chloride: 111 mmol/L (ref 101–111)
GFR calc Af Amer: >60 mL/min (ref >60)
GLUCOSE: 142 mg/dL — AB (ref 65–99)
POTASSIUM: 4.9 mmol/L (ref 3.5–5.1)
SODIUM: 140 mmol/L (ref 135–145)

## 2015-10-26 LAB — URINE CULTURE: Culture: NO GROWTH

## 2015-10-26 LAB — CBC
HCT: 32.3 % — ABNORMAL LOW (ref 36.0–46.0)
Hemoglobin: 10.7 g/dL — ABNORMAL LOW (ref 12.0–15.0)
MCH: 29.2 pg (ref 26.0–34.0)
MCHC: 33.1 g/dL (ref 30.0–36.0)
MCV: 88.3 fL (ref 78.0–100.0)
PLATELETS: 330 10*3/uL (ref 150–400)
RBC: 3.66 MIL/uL — AB (ref 3.87–5.11)
RDW: 15.1 % (ref 11.5–15.5)
WBC: 27 10*3/uL — ABNORMAL HIGH (ref 4.0–10.5)

## 2015-10-26 LAB — MAGNESIUM: MAGNESIUM: 2.1 mg/dL (ref 1.7–2.4)

## 2015-10-26 MED ORDER — METHYLPREDNISOLONE SODIUM SUCC 125 MG IJ SOLR
60.0000 mg | Freq: Two times a day (BID) | INTRAMUSCULAR | Status: DC
  Administered 2015-10-27: 60 mg via INTRAVENOUS
  Filled 2015-10-26: qty 2

## 2015-10-26 MED ORDER — BENZONATATE 100 MG PO CAPS
200.0000 mg | ORAL_CAPSULE | Freq: Three times a day (TID) | ORAL | Status: DC
  Administered 2015-10-26 – 2015-10-28 (×7): 200 mg via ORAL
  Filled 2015-10-26 (×7): qty 2

## 2015-10-26 MED ORDER — HYDROCOD POLST-CPM POLST ER 10-8 MG/5ML PO SUER
5.0000 mL | Freq: Two times a day (BID) | ORAL | Status: DC | PRN
Start: 2015-10-26 — End: 2015-10-28
  Administered 2015-10-27 – 2015-10-28 (×2): 5 mL via ORAL
  Filled 2015-10-26 (×2): qty 5

## 2015-10-26 NOTE — Progress Notes (Signed)
[facility] PROGRESS NOTE  Doris Jones [id number] DOB: 03/25/1966 DOA: 10/24/2015 [facility]  Assessment/Plan: #1 acute bronchitis with bronchospasms Clinical improvement. Repeat chest x-ray negative for any acute infiltrate. Influenza PCR is negative. Taper IV steroids. Continue IV Levaquin, oxygen, Mucinex, scheduled nebs. Discontinue Tamiflu. Follow.  #2 acute severe hypokalemia Questionable etiology. Patient was on diuretics. Patient was also noted to be taken some weight loss/diet pill which may have contributed to the patient's hypokalemia. Potassium has been repleted. Follow.  #3 sinus tachycardia Improved. Secondary to problem #1 and 2. TSH slightly decreased however will repeat thyroid function studies as outpatient in about 4-6 weeks. Continue IV fluids. Supportive care.  #4 hypertension Continue Norvasc. Hyzaar on hold.  #5 leukocytosis Likely secondary to steroids. Repeat chest x-ray negative. Urinalysis is negative. Patient on empiric IV Levaquin. Follow for now.  6 prophylaxis Lovenox for DVT prophylaxis.  Code Status: Full Family Communication: none at bedside.  Disposition Plan:  Home when medically stable.   Consultants:  None  Procedures:  Chest x-ray 10/24/2015, 10/25/2015  Antibiotics:  IV Levaquin 10/24/2015  HPI/Subjective: Persistent cough.   Objective: Filed Vitals:   10/26/15 1413  BP: 141/88  Pulse: 76  Temp: 98.3 F (36.8 C)  Resp: 20    Intake/Output Summary (Last 24 hours) at 10/26/15 1729 Last data filed at 10/26/15 1200  Gross per 24 hour  Intake 3599.58 ml  Output      0 ml  Net 3599.58 ml   Filed Weights   10/24/15 1645 10/25/15 0536 10/26/15 0500  Weight: 78.3 kg (172 lb 9.9 oz) 80 kg (176 lb 5.9 oz) 80.876 kg (178 lb 4.8 oz)    Exam:   General:  NAD  Cardiovascular: RRR  Respiratory: Coarse rhonchorous breath sounds. Decreased expiratory wheezing.  Abdomen: Soft, nontender,  nondistended, positive bowel sounds.  Musculoskeletal: No clubbing cyanosis or edema.  Data Reviewed: Basic Metabolic Panel:  Recent Labs Lab 10/24/15 1214 10/24/15 1835 10/25/15 0444 10/26/15 0455  NA 139 140 138 140  K <2.0* 2.5* 5.2* 4.9  CL 102 106 111 111  CO2 27 18* 21* 24  GLUCOSE 130* 216* 154* 142*  BUN 13 10 8 16   CREATININE 0.84 0.88 0.62 0.66  CALCIUM 9.9 9.0 8.8* 9.0  MG 1.7  --   --  2.1   Liver Function Tests: No results for input(s): AST, ALT, ALKPHOS, BILITOT, PROT, ALBUMIN in the last 168 hours. No results for input(s): LIPASE, AMYLASE in the last 168 hours. No results for input(s): AMMONIA in the last 168 hours. CBC:  Recent Labs Lab 10/24/15 1157 10/25/15 0444 10/26/15 0455  WBC 7.7 19.9* 27.0*  NEUTROABS 3.6  --   --   HGB 12.6 11.9* 10.7*  HCT 35.9* 34.5* 32.3*  MCV 82.7 85.0 88.3  PLT 367 351 330   Cardiac Enzymes: No results for input(s): CKTOTAL, CKMB, CKMBINDEX, TROPONINI in the last 168 hours. BNP (last 3 results) No results for input(s): BNP in the last 8760 hours.  ProBNP (last 3 results) No results for input(s): PROBNP in the last 8760 hours.  CBG: No results for input(s): GLUCAP in the last 168 hours.  Recent Results (from the past 240 hour(s))  Urine culture     Status: None   Collection Time: 10/25/15 12:47 AM  Result Value Ref Range Status   Specimen Description URINE, RANDOM  Final   Special Requests NONE  Final   Culture   Final    NO GROWTH  1 DAY Performed at [facility]    Report Status 10/26/2015 FINAL  Final     Studies: Dg Chest 2 View  10/25/2015  CLINICAL DATA:  Cough shortness of breath hypertension nonsmoker EXAM: CHEST  2 VIEW COMPARISON:  10/24/2015 FINDINGS: Heart size and vascular pattern normal. No consolidation or effusion. IMPRESSION: No acute findings Electronically Signed   By: Esperanza Heir M.D.   On: 10/25/2015 08:40    Scheduled Meds: . amLODipine  5 mg Oral Daily  . benzonatate   200 mg Oral TID  . enoxaparin (LOVENOX) injection  40 mg Subcutaneous Q24H  . fluticasone  2 spray Each Nare Daily  . ipratropium  0.5 mg Nebulization Q6H  . levalbuterol  0.63 mg Nebulization Q6H  . levofloxacin (LEVAQUIN) IV  500 mg Intravenous Q24H  . [START ON 10/27/2015] methylPREDNISolone (SOLU-MEDROL) injection  60 mg Intravenous Q12H  . sodium chloride  3 mL Intravenous Q12H   Continuous Infusions:    Principal Problem:   Acute bronchitis with bronchospasm Active Problems:   Acute hypokalemia   HTN (hypertension)   Sinus tachycardia (HCC)   Hypokalemia   Leukocytosis    Time spent: 15 minutes    Martine Bleecker M.D. [facility] Pager [phone] If 7PM-7AM, please contact night-coverage at [email or web address], password [facility] 10/26/2015, 5:29 PM  LOS: 2 days

## 2015-10-27 LAB — BASIC METABOLIC PANEL
ANION GAP: 9 (ref 5–15)
BUN: 17 mg/dL (ref 6–20)
CALCIUM: 9.4 mg/dL (ref 8.9–10.3)
CO2: 25 mmol/L (ref 22–32)
CREATININE: 0.64 mg/dL (ref 0.44–1.00)
Chloride: 103 mmol/L (ref 101–111)
GLUCOSE: 165 mg/dL — AB (ref 65–99)
Potassium: 4 mmol/L (ref 3.5–5.1)
Sodium: 137 mmol/L (ref 135–145)

## 2015-10-27 LAB — CBC
HCT: 36.4 % (ref 36.0–46.0)
Hemoglobin: 12.5 g/dL (ref 12.0–15.0)
MCH: 30.3 pg (ref 26.0–34.0)
MCHC: 34.3 g/dL (ref 30.0–36.0)
MCV: 88.1 fL (ref 78.0–100.0)
PLATELETS: 340 10*3/uL (ref 150–400)
RBC: 4.13 MIL/uL (ref 3.87–5.11)
RDW: 14.4 % (ref 11.5–15.5)
WBC: 19.3 10*3/uL — AB (ref 4.0–10.5)

## 2015-10-27 MED ORDER — PREDNISONE 20 MG PO TABS
40.0000 mg | ORAL_TABLET | Freq: Every day | ORAL | Status: DC
  Administered 2015-10-28: 40 mg via ORAL
  Filled 2015-10-27: qty 2

## 2015-10-27 MED ORDER — LEVOFLOXACIN 500 MG PO TABS
500.0000 mg | ORAL_TABLET | Freq: Every day | ORAL | Status: DC
  Administered 2015-10-27 – 2015-10-28 (×2): 500 mg via ORAL
  Filled 2015-10-27 (×2): qty 1

## 2015-10-27 NOTE — Progress Notes (Signed)
[facility] PROGRESS NOTE  Doris Jones JYN:[id number] DOB: July 05, 1966 DOA: 10/24/2015 [facility]  Assessment/Plan: #1 acute bronchitis with bronchospasms Clinical improvement. Repeat chest x-ray negative for any acute infiltrate. Influenza PCR is negative. Taper IV steroids to po steroids from am.. Change to po levquin, and added hycodan.   #2 acute severe hypokalemia Questionable etiology. Patient was on diuretics. Patient was also noted to be taken some weight loss/diet pill which may have contributed to the patient's hypokalemia. Potassium has been repleted. Follow.  #3 sinus tachycardia Improved. Secondary to problem #1 and 2. TSH slightly decreased however will repeat thyroid function studies as outpatient in about 4-6 weeks. Continue IV fluids. Supportive care.  #4 hypertension Continue Norvasc. Hyzaar on hold.  #5 leukocytosis Likely secondary to steroids. Repeat chest x-ray negative. Urinalysis is negative. Patient on empiric IV Levaquin. Follow for now.  6 prophylaxis Lovenox for DVT prophylaxis.  Code Status: Full Family Communication: none at bedside.  Disposition Plan:  Home when medically stable, possibly tomorrow.    Consultants:  None  Procedures:  Chest x-ray 10/24/2015, 10/25/2015  Antibiotics:  IV Levaquin 10/24/2015  HPI/Subjective: Persistent cough.   Objective: Filed Vitals:   10/27/15 1330  BP: 116/72  Pulse: 64  Temp: 97.9 F (36.6 C)  Resp: 20    Intake/Output Summary (Last 24 hours) at 10/27/15 1740 Last data filed at 10/27/15 0630  Gross per 24 hour  Intake    120 ml  Output      0 ml  Net    120 ml   Filed Weights   10/25/15 0536 10/26/15 0500 10/27/15 0530  Weight: 80 kg (176 lb 5.9 oz) 80.876 kg (178 lb 4.8 oz) 82.9 kg (182 lb 12.2 oz)    Exam:   General:  NAD  Cardiovascular: RRR  Respiratory: Coarse rhonchorous breath sounds. Decreased expiratory wheezing.  Abdomen: Soft, nontender,  nondistended, positive bowel sounds.  Musculoskeletal: No clubbing cyanosis or edema.  Data Reviewed: Basic Metabolic Panel:  Recent Labs Lab 10/24/15 1214 10/24/15 1835 10/25/15 0444 10/26/15 0455 10/27/15 1005  NA 139 140 138 140 137  K <2.0* 2.5* 5.2* 4.9 4.0  CL 102 106 111 111 103  CO2 27 18* 21* 24 25  GLUCOSE 130* 216* 154* 142* 165*  BUN 13 10 8 16 17   CREATININE 0.84 0.88 0.62 0.66 0.64  CALCIUM 9.9 9.0 8.8* 9.0 9.4  MG 1.7  --   --  2.1  --    Liver Function Tests: No results for input(s): AST, ALT, ALKPHOS, BILITOT, PROT, ALBUMIN in the last 168 hours. No results for input(s): LIPASE, AMYLASE in the last 168 hours. No results for input(s): AMMONIA in the last 168 hours. CBC:  Recent Labs Lab 10/24/15 1157 10/25/15 0444 10/26/15 0455 10/27/15 1005  WBC 7.7 19.9* 27.0* 19.3*  NEUTROABS 3.6  --   --   --   HGB 12.6 11.9* 10.7* 12.5  HCT 35.9* 34.5* 32.3* 36.4  MCV 82.7 85.0 88.3 88.1  PLT 367 351 330 340   Cardiac Enzymes: No results for input(s): CKTOTAL, CKMB, CKMBINDEX, TROPONINI in the last 168 hours. BNP (last 3 results) No results for input(s): BNP in the last 8760 hours.  ProBNP (last 3 results) No results for input(s): PROBNP in the last 8760 hours.  CBG: No results for input(s): GLUCAP in the last 168 hours.  Recent Results (from the past 240 hour(s))  Urine culture     Status: None   Collection Time:  10/25/15 12:47 AM  Result Value Ref Range Status   Specimen Description URINE, RANDOM  Final   Special Requests NONE  Final   Culture   Final    NO GROWTH 1 DAY Performed at [facility]    Report Status 10/26/2015 FINAL  Final     Studies: No results found.  Scheduled Meds: . amLODipine  5 mg Oral Daily  . benzonatate  200 mg Oral TID  . enoxaparin (LOVENOX) injection  40 mg Subcutaneous Q24H  . fluticasone  2 spray Each Nare Daily  . ipratropium  0.5 mg Nebulization Q6H  . levalbuterol  0.63 mg Nebulization Q6H  .  levofloxacin  500 mg Oral Daily  . [START ON 10/28/2015] predniSONE  40 mg Oral QAC breakfast  . sodium chloride  3 mL Intravenous Q12H   Continuous Infusions:    Principal Problem:   Acute bronchitis with bronchospasm Active Problems:   Acute hypokalemia   HTN (hypertension)   Sinus tachycardia (HCC)   Hypokalemia   Leukocytosis    Time spent: 15 minutes    Marquiz Sotelo M.D. [facility] Pager [phone] If 7PM-7AM, please contact night-coverage at [email or web address], password [facility] 10/27/2015, 5:40 PM  LOS: 3 days

## 2015-10-27 NOTE — Progress Notes (Signed)
10/27/15 1245  No IV access. Dr Blake DivineAkula notified. IV team and at least one floor RN has tried to obtain IV. Pt refused to be stuck again.

## 2015-10-28 DIAGNOSIS — D72829 Elevated white blood cell count, unspecified: Secondary | ICD-10-CM

## 2015-10-28 DIAGNOSIS — E876 Hypokalemia: Secondary | ICD-10-CM

## 2015-10-28 MED ORDER — HYDROCOD POLST-CPM POLST ER 10-8 MG/5ML PO SUER: 5.0000 mL | Freq: Two times a day (BID) | ORAL | Status: AC

## 2015-10-28 MED ORDER — LEVOFLOXACIN 500 MG PO TABS: 500.0000 mg | ORAL_TABLET | Freq: Every day | ORAL | Status: DC

## 2015-10-28 MED ORDER — GUAIFENESIN-DM 100-10 MG/5ML PO SYRP: 5.0000 mL | ORAL_SOLUTION | ORAL | Status: DC | PRN

## 2015-10-28 MED ORDER — IPRATROPIUM-ALBUTEROL 0.5-2.5 (3) MG/3ML IN SOLN: 3.0000 mL | Freq: Four times a day (QID) | RESPIRATORY_TRACT | Status: DC | PRN

## 2015-10-28 MED ORDER — BENZONATATE 200 MG PO CAPS: 200.0000 mg | ORAL_CAPSULE | Freq: Three times a day (TID) | ORAL | Status: DC | PRN

## 2015-10-28 MED ORDER — OXYCODONE HCL 5 MG PO TABS: 5.0000 mg | ORAL_TABLET | ORAL | Status: DC | PRN

## 2015-10-28 MED ORDER — PREDNISONE 20 MG PO TABS: ORAL_TABLET | ORAL | Status: DC

## 2015-10-28 NOTE — Progress Notes (Signed)
Patient verbalized understanding of discharge instructions. Patient is stable at discharge. 

## 2015-10-28 NOTE — Plan of Care (Signed)
Problem: Tissue Perfusion: Goal: Risk factors for ineffective tissue perfusion will decrease Outcome: Progressing Subcutaneous lovenox injections daily

## 2015-10-31 NOTE — Discharge Summary (Signed)
Physician Discharge Summary  Doris Jones NFA:[id number] DOB: [birth year] DOA: 10/24/2015  PCP: [facility]  Admit date: 10/24/2015 Discharge date: 10/28/2015  Time spent: 20 minutes  Recommendations for Outpatient Follow-up:  1. Follow up with PCP in one week.    Discharge Diagnoses:  Principal Problem:   Acute bronchitis with bronchospasm Active Problems:   Acute hypokalemia   HTN (hypertension)   Sinus tachycardia (HCC)   Hypokalemia   Leukocytosis   Discharge Condition: improved  Diet recommendation: regualr/  Low sodium diet.    Filed Weights   10/26/15 0500 10/27/15 0530 10/28/15 0610  Weight: 80.876 kg (178 lb 4.8 oz) 82.9 kg (182 lb 12.2 oz) 79.833 kg (176 lb)    History of present illness:  Doris Jones is a 49 y.o. 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 She was admitted for severe bronchitis requiring IV steroids and IV antibiotics. CXR does not show any pneumonia.   Hospital Course:  #1 acute bronchitis with bronchospasms Clinical improvement. Repeat chest x-ray negative for any acute infiltrate. Influenza PCR is negative. IV steroids changed to po steroids , no wheezing on discharge, and she was discharged on oral antibiotics and cough medications for persistent cough.   #2 acute severe hypokalemia Questionable etiology. Patient was on diuretics. Patient was also noted to be taken some weight loss/diet pill which may have contributed to the patient's hypokalemia. Potassium has been repleted.   #3 sinus tachycardia Improved. Secondary to problem #1 and 2. TSH slightly decreased however will repeat thyroid function studies as outpatient  in about 4-6 weeks. Her tachycardia resolved.   #4 hypertension Continue anti hypertensive meds on discharge.  #5 leukocytosis Likely secondary to steroids. Repeat chest x-ray negative. Urinalysis is negative.   Procedures:  none  Consultations:  none  Discharge Exam: Filed Vitals:   10/28/15 0610  BP: 135/85  Pulse: 51  Temp: 98.1 F (36.7 C)  Resp: 18    General: alert afebrile comfortable.  Cardiovascular: s1s2 Respiratory: ctab  Discharge Instructions   Discharge Instructions    Diet - low sodium heart healthy    Complete by:  As directed      Discharge instructions    Complete by:  As directed   Follow up with pulmonology if your cough doesn't improve in one week. Please see PCP in 2 to  4 weeks and get checked for Diabetes and recheck thyroid levels in 4 weeks.          Discharge Medication List as of 10/28/2015 12:28 PM    START taking these medications   Details  benzonatate (TESSALON) 200 MG capsule Take 1 capsule (200 mg total) by mouth 3 (three) times daily as needed for cough., Starting 10/28/2015, Until Discontinued, Print    chlorpheniramine-HYDROcodone (TUSSIONEX) 10-8 MG/5ML SUER Take 5 mLs by mouth 2 (two) times daily., Starting 10/28/2015, Until Discontinued, Print    guaiFENesin-dextromethorphan (ROBITUSSIN DM) 100-10 MG/5ML syrup Take 5 mLs by mouth every 4 (four) hours as needed for cough., Starting 10/28/2015, Until Discontinued, Print    ipratropium-albuterol (DUONEB) 0.5-2.5 (3) MG/3ML SOLN Take 3 mLs by nebulization every 6 (six) hours as needed., Starting 10/28/2015, Until Discontinued, Print    levofloxacin (LEVAQUIN) 500  MG tablet Take 1 tablet (500 mg total) by mouth daily., Starting 10/28/2015, Until Discontinued, Print    oxyCODONE (OXY IR/ROXICODONE) 5 MG immediate release tablet Take 1 tablet (5 mg total) by mouth every 4 (four) hours as needed for moderate pain., Starting 10/28/2015, Until Discontinued, Print    predniSONE  (DELTASONE) 20 MG tablet Prednisone 20 mg daily for 3 days, Print      CONTINUE these medications which have NOT CHANGED   Details  amLODipine (NORVASC) 5 MG tablet Take 5 mg by mouth daily., Starting 10/14/2015, Until Discontinued, Historical Med    losartan-hydrochlorothiazide (HYZAAR) 100-25 MG tablet Take 1 tablet by mouth daily., Starting 10/14/2015, Until Discontinued, Historical Med    Multiple Vitamins-Minerals (MULTIVITAMIN ADULT) TABS Take 1 tablet by mouth daily., Until Discontinued, Historical Med    vitamin E (VITAMIN E) 400 UNIT capsule Take 400 Units by mouth daily., Until Discontinued, Historical Med      STOP taking these medications     azithromycin (ZITHROMAX) 250 MG tablet      ibuprofen (ADVIL,MOTRIN) 200 MG tablet        Allergies  Allergen Reactions  . Ace Inhibitors Cough  . Latex Rash   Follow-up Information    Follow up with [facility]. Schedule an appointment as soon as possible for a visit in 2 weeks.   Contact information:   [address] [place] [address] [phone]       Follow up with [facility] In 1 week.   Specialty:  Pulmonology   Why:  As needed, if your cough doesn't improve.    Contact information:   [address] [place] [address] [phone]       The results of significant diagnostics from this hospitalization (including imaging, microbiology, ancillary and laboratory) are listed below for reference.    Significant Diagnostic Studies: Dg Chest 2 View  10/25/2015  CLINICAL DATA:  Cough shortness of breath hypertension nonsmoker EXAM: CHEST  2 VIEW COMPARISON:  10/24/2015 FINDINGS: Heart size and vascular pattern normal. No consolidation or effusion. IMPRESSION: No acute findings Electronically Signed   By: Esperanza Heir M.D.   On: 10/25/2015 08:40   Dg Chest 2 View  10/24/2015  CLINICAL DATA:  Shortness of breath beginning this morning. Productive cough. Patient  diagnosed with bronchitis 10/18/2015. Initial encounter. EXAM: CHEST  2 VIEW COMPARISON:  PA and lateral chest 04/08/2013. FINDINGS: Heart size and mediastinal contours are within normal limits. Both lungs are clear. Visualized skeletal structures are unremarkable. IMPRESSION: Negative exam. Electronically Signed   By: Drusilla Kanner M.D.   On: 10/24/2015 13:26    Microbiology: Recent Results (from the past 240 hour(s))  Urine culture     Status: None   Collection Time: 10/25/15 12:47 AM  Result Value Ref Range Status   Specimen Description URINE, RANDOM  Final   Special Requests NONE  Final   Culture   Final    NO GROWTH 1 DAY Performed at [facility]    Report Status 10/26/2015 FINAL  Final     Labs: Basic Metabolic Panel:  Recent Labs Lab 10/24/15 1214 10/24/15 1835 10/25/15 0444 10/26/15 0455 10/27/15 1005  NA 139 140 138 140 137  K <2.0* 2.5* 5.2* 4.9 4.0  CL 102 106 111 111 103  CO2 27 18* 21* 24 25  GLUCOSE 130* 216* 154* 142* 165*  BUN CREATININE 0.84 0.88 0.62 0.66 0.64  CALCIUM  9.9 9.0 8.8* 9.0 9.4  MG 1.7  --   --  2.1  --    Liver Function Tests: No results for input(s): AST, ALT, ALKPHOS, BILITOT, PROT, ALBUMIN in the last 168 hours. No results for input(s): LIPASE, AMYLASE in the last 168 hours. No results for input(s): AMMONIA in the last 168 hours. CBC:  Recent Labs Lab 10/24/15 1157 10/25/15 0444 10/26/15 0455 10/27/15 1005  WBC 7.7 19.9* 27.0* 19.3*  NEUTROABS 3.6  --   --   --   HGB 12.6 11.9* 10.7* 12.5  HCT 35.9* 34.5* 32.3* 36.4  MCV 82.7 85.0 88.3 88.1  PLT 367 351 330 340   Cardiac Enzymes: No results for input(s): CKTOTAL, CKMB, CKMBINDEX, TROPONINI in the last 168 hours. BNP: BNP (last 3 results) No results for input(s): BNP in the last 8760 hours.  ProBNP (last 3 results) No results for input(s): PROBNP in the last 8760 hours.  CBG: No results for input(s): GLUCAP in the last 168  hours.     SignedKathlen Mody:  Jaretzi Droz  [facility] 10/31/2015, 10:14 AM

## 2015-11-14 ENCOUNTER — Telehealth: Payer: Self-pay | Admitting: Pulmonary Disease

## 2015-11-14 NOTE — Telephone Encounter (Signed)
Per d/c summary: Follow up with [facility] In 1 week.    Specialty: Pulmonology   Why: As needed, if your cough doesn't improve      --  None of our docs saw pt in the hospital. Called spoke with pt. She scheduled appt with MW on 12/6 at 2:45. Nothing further needed

## 2015-11-22 ENCOUNTER — Institutional Professional Consult (permissible substitution): Payer: BLUE CROSS/BLUE SHIELD | Admitting: Internal Medicine

## 2015-12-30 ENCOUNTER — Encounter: Payer: Self-pay | Admitting: Internal Medicine

## 2015-12-30 ENCOUNTER — Ambulatory Visit (INDEPENDENT_AMBULATORY_CARE_PROVIDER_SITE_OTHER): Payer: BLUE CROSS/BLUE SHIELD | Admitting: Internal Medicine

## 2015-12-30 VITALS — BP 118/68 | HR 45 | Ht 61.0 in | Wt 176.8 lb

## 2015-12-30 DIAGNOSIS — R058 Other specified cough: Secondary | ICD-10-CM | POA: Insufficient documentation

## 2015-12-30 DIAGNOSIS — R05 Cough: Secondary | ICD-10-CM | POA: Diagnosis not present

## 2015-12-30 MED ORDER — TRAMADOL HCL 50 MG PO TABS
ORAL_TABLET | ORAL | Status: DC
Start: 2015-12-30 — End: 2018-07-23

## 2015-12-30 MED ORDER — FAMOTIDINE 20 MG PO TABS: ORAL_TABLET | ORAL | Status: DC

## 2015-12-30 MED ORDER — PANTOPRAZOLE SODIUM 40 MG PO TBEC: 40.0000 mg | DELAYED_RELEASE_TABLET | Freq: Every day | ORAL | Status: DC

## 2015-12-30 MED ORDER — PREDNISONE 10 MG PO TABS: ORAL_TABLET | ORAL | Status: DC

## 2015-12-30 NOTE — Patient Instructions (Signed)
The key to effective treatment for your cough is eliminating the non-stop cycle of cough you're stuck in long enough to let your airway heal completely and then see if there is anything still making you cough once you stop the cough suppression, but this should take no more than 5 days to figure out  First take delsym two tsp every 12 hours and supplement if needed with  tramadol 50 mg up to 2 every 4 hours to suppress the urge to cough at all or even clear your throat. Swallowing water or using ice chips/non mint and menthol containing candies (such as lifesavers or sugarless jolly ranchers) are also effective.  You should rest your voice and avoid activities that you know make you cough.  Once you have eliminated the cough for 3 straight days try reducing the tramadol first,  then the delsym as tolerated.    Prednisone 10 mg take  4 each am x 2 days,   2 each am x 2 days,  1 each am x 2 days and stop (this is to eliminate allergies and inflammation from coughing)  Protonix (pantoprazole) Take 30-60 min before first meal of the day and Pepcid 20 mg one bedtime plus chlorpheniramine 4 mg x 2 at bedtime (both available over the counter)  until cough is completely gone for at least a week without the need for cough suppression  GERD (REFLUX)  is an extremely common cause of respiratory symptoms, many times with no significant heartburn at all.    It can be treated with medication, but also with lifestyle changes including avoidance of late meals, excessive alcohol, smoking cessation, and avoid fatty foods, chocolate, peppermint, colas, red wine, and acidic juices such as orange juice.  NO MINT OR MENTHOL PRODUCTS SO NO COUGH DROPS  USE HARD CANDY INSTEAD (jolley ranchers or Stover's or Lifesavers (all available in sugarless versions) NO OIL BASED VITAMINS - use powdered substitutes.      

## 2015-12-30 NOTE — Progress Notes (Signed)
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 PULMONARY CONSULT    Pt was admitted 11/16 for bronchitis. Pt states that cough has not improved. Pt c/o cough with yellow/brown mucus, wheeze and SOB. Pt denies CP/tightness.   onset first in November > primary rx erythromycin 10/18/15 > no better and worse sob > 11//7/16 ER > admit as above  Breathing better and cough never did resolve  and then gradually worse using duoneb up to sev time a week / worse at bedtime and better sitting and with narc cough meds   No obvious day to day or daytime variability or assoc excess/ purulent sputum or mucus plugs   or cp or chest tightness,  or overt sinus or hb symptoms. No unusual exp hx or h/o childhood pna/ asthma or knowledge of premature birth.   Also denies any obvious fluctuation of symptoms with weather or environmental changes or other aggravating or alleviating factors except as outlined above   Current Medications, Allergies, Complete Past Medical History, Past Surgical History, Family History, and Social History were reviewed in [facility] electronic medical record.       Review of Systems  Constitutional: Negative.  Negative for fever and unexpected weight change.  HENT: Positive for rhinorrhea and sore throat. Negative for congestion, dental problem, ear pain, nosebleeds, postnasal drip,  sinus pressure, sneezing and trouble swallowing.   Eyes: Negative.  Negative for redness and itching.  Respiratory: Positive for cough, shortness of breath and wheezing. Negative for chest tightness.   Cardiovascular: Negative.  Negative for palpitations and leg swelling.  Gastrointestinal: Negative.  Negative for nausea and vomiting.  Endocrine: Negative.   Genitourinary: Negative.  Negative for dysuria.  Musculoskeletal: Negative.  Negative for joint swelling.  Skin: Negative.  Negative for rash.  Allergic/Immunologic: Negative.   Neurological: Negative.  Negative for headaches.  Hematological: Negative.  Does not bruise/bleed easily.  Psychiatric/Behavioral: Negative.  Negative for dysphoric mood. The patient is not nervous/anxious.        Objective:   Physical Exam  amb bf nad   Wt Readings from Last 3 Encounters:  12/30/15 176 lb 12.8 oz (80.196 kg)  10/28/15 176 lb (79.833 kg)    Vital signs reviewed   HEENT: nl dentition, turbinates, and oropharynx. Nl external ear canals without cough reflex   NECK :  without JVD/Nodes/TM/ nl carotid upstrokes bilaterally   LUNGS: no acc muscle use,  Nl contour chest which is clear to A and P bilaterally with cough on insp     CV:  RRR  no s3 or murmur or increase in P2, no edema   ABD:  soft and nontender with nl inspiratory excursion in the supine position. No bruits or organomegaly, bowel sounds nl  MS:  Nl gait/ ext warm without deformities, calf tenderness, cyanosis or clubbing No obvious joint restrictions   SKIN: warm and dry without  lesions    NEURO:  alert, approp, nl sensorium with  no motor deficits      I personally reviewed images and agree with radiology impression as follows:  CXR:  10/25/15 No acute findings        Assessment & Plan:

## 2015-12-31 ENCOUNTER — Encounter: Payer: Self-pay | Admitting: Internal Medicine

## 2015-12-31 NOTE — Assessment & Plan Note (Addendum)
The most common causes of chronic cough in immunocompetent adults include the following: upper airway cough syndrome (UACS), previously referred to as postnasal drip syndrome (PNDS), which is caused by variety of rhinosinus conditions; (2) asthma; (3) GERD; (4) chronic bronchitis from cigarette smoking or other inhaled environmental irritants; (5) nonasthmatic eosinophilic bronchitis; and (6) bronchiectasis.   These conditions, singly or in combination, have accounted for up to 94% of the causes of chronic cough in prospective studies.   Other conditions have constituted no >6% of the causes in prospective studies These have included bronchogenic carcinoma, chronic interstitial pneumonia, sarcoidosis, left ventricular failure, ACEI-induced cough, and aspiration from a condition associated with pharyngeal dysfunction.    Chronic cough is often simultaneously caused by more than one condition. A single cause has been found from 38 to 82% of the time, multiple causes from 18 to 62%. Multiply caused cough has been the result of three diseases up to 42% of the time.       Based on hx and exam, ddx =  Cough variant asthma vs more likely Upper airway cough syndrome, so named because it's frequently impossible to sort out how much is  CR/sinusitis with freq throat clearing (which can be related to primary GERD)   vs  causing  secondary (" extra esophageal")  GERD from wide swings in gastric pressure that occur with throat clearing, often  promoting self use of mint and menthol lozenges that reduce the lower esophageal sphincter tone and exacerbate the problem further in a cyclical fashion.   These are the same pts (now being labeled as having "irritable larynx syndrome" by some cough centers) who not infrequently have a history of having failed to tolerate ace inhibitors,  dry powder inhalers or biphosphonates or report having atypical reflux symptoms that don't respond to standard doses of PPI , and are easily  confused as having aecopd or asthma flares by even experienced allergists/ pulmonologists.   The first step is to maximize acid suppression and eliminate cyclical coughing then regroup if the cough persists.  I had an extended discussion with the patient reviewing all relevant studies completed to date and  lasting 25/45 min initial eval    Each maintenance medication was reviewed in detail including most importantly the difference between maintenance and prns and under what circumstances the prns are to be triggered using an action plan format that is not reflected in the computer generated alphabetically organized AVS.    Please see instructions for details which were reviewed in writing and the patient given a copy highlighting the part that I personally wrote and discussed at today's ov.   See instructions for specific recommendations which were reviewed directly with the patient who was given a copy with highlighter outlining the key components.

## 2016-12-11 ENCOUNTER — Encounter (HOSPITAL_COMMUNITY): Payer: Self-pay | Admitting: Family Medicine

## 2016-12-11 DIAGNOSIS — N390 Urinary tract infection, site not specified: Secondary | ICD-10-CM | POA: Diagnosis present

## 2016-12-11 DIAGNOSIS — Z91040 Latex allergy status: Secondary | ICD-10-CM | POA: Insufficient documentation

## 2016-12-11 DIAGNOSIS — I10 Essential (primary) hypertension: Secondary | ICD-10-CM | POA: Diagnosis not present

## 2016-12-11 LAB — URINALYSIS, ROUTINE W REFLEX MICROSCOPIC
BILIRUBIN URINE: NEGATIVE
Glucose, UA: NEGATIVE mg/dL
Hgb urine dipstick: NEGATIVE
KETONES UR: NEGATIVE mg/dL
Nitrite: POSITIVE — AB
PROTEIN: NEGATIVE mg/dL
SQUAMOUS EPITHELIAL / LPF: NONE SEEN
Specific Gravity, Urine: 1.011 (ref 1.005–1.030)
pH: 5 (ref 5.0–8.0)

## 2016-12-11 NOTE — ED Triage Notes (Signed)
Patient is complaining of burning with urinating, lower back , nausea, frequency and urgency in urinating. Symptoms started last week. Took OTC AZO and cranberry pills. Denies any fever.

## 2016-12-12 ENCOUNTER — Emergency Department (HOSPITAL_COMMUNITY)
Admission: EM | Admit: 2016-12-12 | Discharge: 2016-12-12 | Disposition: A | Discharge status: Home / Self Care | Payer: BLUE CROSS/BLUE SHIELD | Attending: Emergency Medicine | Admitting: Emergency Medicine

## 2016-12-12 DIAGNOSIS — N390 Urinary tract infection, site not specified: Secondary | ICD-10-CM

## 2016-12-12 MED ORDER — CIPROFLOXACIN HCL 500 MG PO TABS: 500.0000 mg | ORAL_TABLET | Freq: Two times a day (BID) | ORAL | 0 refills | Status: DC

## 2016-12-12 MED ORDER — PHENAZOPYRIDINE HCL 200 MG PO TABS: 200.0000 mg | ORAL_TABLET | Freq: Three times a day (TID) | ORAL | 0 refills | Status: DC | PRN

## 2016-12-12 NOTE — Discharge Instructions (Signed)
Cipro and Pyridium as prescribed.  Drink plenty of fluids and get plenty of rest.  Follow-up with your primary Dr. if not improving in the next 2-3 days.

## 2016-12-12 NOTE — ED Provider Notes (Signed)
[facility]-EMERGENCY DEPT Provider Note   CSN: [id number] Arrival date & time: 12/11/16  2125 By signing my name below, I, Levon HedgerElizabeth Hall, attest that this documentation has been prepared under the direction and in the presence of Geoffery Lyonsouglas Darryl Blumenstein, MD . Electronically Signed: Levon HedgerElizabeth Hall, Scribe. 12/12/2016. 2:42 AM.   History   Chief Complaint Chief Complaint  Patient presents with  . Urinary Tract Infection    HPI Doris Jones is a 50 y.o. female with a hx of HTN, leukocytosis, and hypokalemia who presents to the Emergency Department complaining of progressively worsening dysuria onset last week. She rates her pain while urinating as 10/10 in severity. Pt notes associated flank pain, nausea, urinary frequency, and urinary urgency. She has taken AZO and cranberry pills with no relief. No exacerbating factors noted. She denies any fever. Pt has no other complaints at this time  The history is provided by the patient. No language interpreter was used.    Past Medical History:  Diagnosis Date  . HTN (hypertension) 10/24/2015  . Hypertension     Patient Active Problem List   Diagnosis Date Noted  . Upper airway cough syndrome 12/30/2015  . Leukocytosis 10/25/2015  . Acute hypokalemia 10/24/2015  . Acute bronchitis with bronchospasm 10/24/2015  . HTN (hypertension) 10/24/2015  . Sinus tachycardia 10/24/2015  . Hypokalemia 10/24/2015    Past Surgical History:  Procedure Laterality Date  . ABDOMINAL HYSTERECTOMY    . TUBAL LIGATION      OB History    No data available       Home Medications    Prior to Admission medications   Medication Sig Start Date End Date Taking? Authorizing Provider  amLODipine (NORVASC) 5 MG tablet Take 5 mg by mouth daily. 10/14/15   Historical Provider, MD  benzonatate (TESSALON) 200 MG capsule Take 1 capsule (200 mg total) by mouth 3 (three) times daily as needed for cough. 10/28/15   Kathlen ModyVijaya Akula, MD  chlorpheniramine-HYDROcodone (TUSSIONEX)  10-8 MG/5ML SUER Take 5 mLs by mouth 2 (two) times daily. 10/28/15   Kathlen ModyVijaya Akula, MD  famotidine (PEPCID) 20 MG tablet One at bedtime 12/30/15   Nyoka CowdenMichael B Wert, MD  guaiFENesin-dextromethorphan [facility](ROBITUSSIN DM) 100-10 MG/5ML syrup Take 5 mLs by mouth every 4 (four) hours as needed for cough. 10/28/15   Kathlen ModyVijaya Akula, MD  ipratropium-albuterol (DUONEB) 0.5-2.5 (3) MG/3ML SOLN Take 3 mLs by nebulization every 6 (six) hours as needed. 10/28/15   Kathlen ModyVijaya Akula, MD  losartan-hydrochlorothiazide (HYZAAR) 100-25 MG tablet Take 1 tablet by mouth daily. 10/14/15   Historical Provider, MD  Multiple Vitamins-Minerals (MULTIVITAMIN ADULT) TABS Take 1 tablet by mouth daily.    Historical Provider, MD  oxyCODONE (OXY IR/ROXICODONE) 5 MG immediate release tablet Take 1 tablet (5 mg total) by mouth every 4 (four) hours as needed for moderate pain. 10/28/15   Kathlen ModyVijaya Akula, MD  pantoprazole (PROTONIX) 40 MG tablet Take 1 tablet (40 mg total) by mouth daily. Take 30-60 min before first meal of the day 12/30/15   Nyoka CowdenMichael B Wert, MD  predniSONE (DELTASONE) 10 MG tablet Take  4 each am x 2 days,   2 each am x 2 days,  1 each am x 2 days and stop 12/30/15   Nyoka CowdenMichael B Wert, MD  traMADol Janean Sark(ULTRAM) 50 MG tablet 1-2 every 4 hours as needed for cough or pain 12/30/15   Nyoka CowdenMichael B Wert, MD  vitamin E (VITAMIN E) 400 UNIT capsule Take 400 Units by mouth daily.    Historical Provider,  MD    Family History History reviewed. No pertinent family history.  Social History Social History  Substance Use Topics  . Smoking status: Never Smoker  . Smokeless tobacco: Never Used  . Alcohol use Yes     Comment: Once a month     Allergies   Ace inhibitors and Latex   Review of Systems Review of Systems 10 systems reviewed and all are negative for acute change except as noted in the HPI.   Physical Exam Updated Vital Signs BP (!) 178/113 (BP Location: Right Arm) Comment: Took took last BP medication yesterday but reports she will get  a refill on Friday.   Pulse 86   Temp 98.5 F (36.9 C) (Oral)   Resp 18   Ht 5\' 1"  (1.549 m)   Wt 172 lb (78 kg)   SpO2 97%   BMI 32.50 kg/m   Physical Exam  Constitutional: She is oriented to person, place, and time. She appears well-developed and well-nourished. No distress.  HENT:  Head: Normocephalic and atraumatic.  Eyes: EOM are normal.  Neck: Normal range of motion.  Cardiovascular: Normal rate, regular rhythm and normal heart sounds.   Pulmonary/Chest: Effort normal and breath sounds normal.  Abdominal: Soft. She exhibits no distension. There is no tenderness.  Musculoskeletal: Normal range of motion. She exhibits tenderness.  Mild lumbar tenderness  Neurological: She is alert and oriented to person, place, and time.  Skin: Skin is warm and dry.  Psychiatric: She has a normal mood and affect. Judgment normal.  Nursing note and vitals reviewed.   ED Treatments / Results  DIAGNOSTIC STUDIES:  Oxygen Saturation is 97% on RA, normal by my interpretation.    COORDINATION OF CARE:  2:40 AM Discussed treatment plan with pt at bedside and pt agreed to plan.  Labs (all labs ordered are listed, but only abnormal results are displayed) Labs Reviewed  URINALYSIS, ROUTINE W REFLEX MICROSCOPIC - Abnormal; Notable for the following:       Result Value   Color, Urine AMBER (*)    Nitrite POSITIVE (*)    Leukocytes, UA TRACE (*)    Bacteria, UA RARE (*)    All other components within normal limits    EKG  EKG Interpretation None       Radiology No results found.  Procedures Procedures (including critical care time)  Medications Ordered in ED Medications - No data to display   Initial Impression / Assessment and Plan / ED Course  I have reviewed the triage vital signs and the nursing notes.  Pertinent labs & imaging results that were available during my care of the patient were reviewed by me and considered in my medical decision making (see chart for  details).  Clinical Course     UA reveals a UTI. This will be treated with antibiotics and when necessary return.  Final Clinical Impressions(s) / ED Diagnoses   Final diagnoses:  None    New Prescriptions New Prescriptions   No medications on file  I personally performed the services described in this documentation, which was scribed in my presence. The recorded information has been reviewed and is accurate.        Geoffery Lyonsouglas Miyonna Ormiston, MD 12/13/16 [phone]

## 2017-03-05 ENCOUNTER — Emergency Department (HOSPITAL_COMMUNITY)
Admission: EM | Admit: 2017-03-05 | Discharge: 2017-03-05 | Disposition: A | Discharge status: Home / Self Care | Payer: BLUE CROSS/BLUE SHIELD | Attending: Emergency Medicine | Admitting: Emergency Medicine

## 2017-03-05 ENCOUNTER — Encounter (HOSPITAL_COMMUNITY): Payer: Self-pay | Admitting: Emergency Medicine

## 2017-03-05 ENCOUNTER — Emergency Department (HOSPITAL_COMMUNITY): Payer: BLUE CROSS/BLUE SHIELD

## 2017-03-05 DIAGNOSIS — R0789 Other chest pain: Secondary | ICD-10-CM | POA: Insufficient documentation

## 2017-03-05 DIAGNOSIS — Z79899 Other long term (current) drug therapy: Secondary | ICD-10-CM | POA: Insufficient documentation

## 2017-03-05 DIAGNOSIS — Z91040 Latex allergy status: Secondary | ICD-10-CM | POA: Diagnosis not present

## 2017-03-05 DIAGNOSIS — I10 Essential (primary) hypertension: Secondary | ICD-10-CM | POA: Diagnosis not present

## 2017-03-05 DIAGNOSIS — R079 Chest pain, unspecified: Secondary | ICD-10-CM | POA: Diagnosis present

## 2017-03-05 LAB — COMPREHENSIVE METABOLIC PANEL
ALK PHOS: 82 U/L (ref 38–126)
ALT: 24 U/L (ref 14–54)
AST: 23 U/L (ref 15–41)
Albumin: 3.9 g/dL (ref 3.5–5.0)
Anion gap: 10 (ref 5–15)
BILIRUBIN TOTAL: 0.6 mg/dL (ref 0.3–1.2)
BUN: 7 mg/dL (ref 6–20)
CALCIUM: 9.9 mg/dL (ref 8.9–10.3)
CO2: 29 mmol/L (ref 22–32)
CREATININE: 0.69 mg/dL (ref 0.44–1.00)
Chloride: 100 mmol/L — ABNORMAL LOW (ref 101–111)
Glucose, Bld: 111 mg/dL — ABNORMAL HIGH (ref 65–99)
Potassium: 3.4 mmol/L — ABNORMAL LOW (ref 3.5–5.1)
Sodium: 139 mmol/L (ref 135–145)
TOTAL PROTEIN: 6.3 g/dL — AB (ref 6.5–8.1)

## 2017-03-05 LAB — I-STAT TROPONIN, ED
Troponin i, poc: 0 ng/mL (ref 0.00–0.08)
Troponin i, poc: 0 ng/mL (ref 0.00–0.08)

## 2017-03-05 LAB — CBC
HEMATOCRIT: 37.3 % (ref 36.0–46.0)
HEMOGLOBIN: 12.5 g/dL (ref 12.0–15.0)
MCH: 28.3 pg (ref 26.0–34.0)
MCHC: 33.5 g/dL (ref 30.0–36.0)
MCV: 84.6 fL (ref 78.0–100.0)
Platelets: 286 10*3/uL (ref 150–400)
RBC: 4.41 MIL/uL (ref 3.87–5.11)
RDW: 13.5 % (ref 11.5–15.5)
WBC: 5.5 10*3/uL (ref 4.0–10.5)

## 2017-03-05 MED ORDER — MORPHINE SULFATE (PF) 4 MG/ML IV SOLN
2.0000 mg | Freq: Once | INTRAVENOUS | Status: AC
  Administered 2017-03-05: 2 mg via INTRAVENOUS
  Filled 2017-03-05: qty 1

## 2017-03-05 NOTE — ED Notes (Signed)
Pt states she has been having diarrhea a lot after she eats.

## 2017-03-05 NOTE — ED Notes (Signed)
Pt ambulated to restroom from bed in hallway, tolerated well.

## 2017-03-05 NOTE — ED Triage Notes (Signed)
Pt was at work; substernal chest pain 10/10. Reproducible and nonradiating. Went to Urgent Care. Asprin given and 1 nitro. Did not help pain or change it. A&Ox4; states she had SOB associated with it. UC put her on oxygen and she felt better.

## 2017-03-05 NOTE — ED Provider Notes (Signed)
[facility]-EMERGENCY DEPT Provider Note   CSN: [id number] Arrival date & time: 03/05/17  1041     History   Chief Complaint Chief Complaint  Patient presents with  . Chest Pain    HPI Doris Jones is a 51 y.o. female.  Doris Jones is a 51 y.o. female who presents to the ER with a chief complaint of 10/10 chest pain. It is substernal, radiates to her right breast, feels sharp and stabbing.  She had a similar episode Sunday but it only lasted a few minutes and went away with out intervention.   Today her chest pain started at 0930, has since been constant, is worsened with taking a deep breath, movement, palpation of sternum, better with not moving and has not been associated with shortness of breath.  She is not nauseaous, states she takes her bp meds as directed.  She went to her Dr. Isidore Moos this morning and reports they gave her aspirin, one nitro (which gave her a headache, did not change her chest pain.)         Past Medical History:  Diagnosis Date  . HTN (hypertension) 10/24/2015  . Hypertension     Patient Active Problem List   Diagnosis Date Noted  . Upper airway cough syndrome 12/30/2015  . Leukocytosis 10/25/2015  . Acute hypokalemia 10/24/2015  . Acute bronchitis with bronchospasm 10/24/2015  . HTN (hypertension) 10/24/2015  . Sinus tachycardia 10/24/2015  . Hypokalemia 10/24/2015    Past Surgical History:  Procedure Laterality Date  . ABDOMINAL HYSTERECTOMY    . TUBAL LIGATION      OB History    No data available       Home Medications    Prior to Admission medications   Medication Sig Start Date End Date Taking? Authorizing Provider  amLODipine (NORVASC) 5 MG tablet Take 5 mg by mouth daily. 10/14/15   Historical Provider, MD  benzonatate (TESSALON) 200 MG capsule Take 1 capsule (200 mg total) by mouth 3 (three) times daily as needed for cough. 10/28/15   Kathlen Mody, MD  chlorpheniramine-HYDROcodone (TUSSIONEX) 10-8 MG/5ML SUER Take 5 mLs by  mouth 2 (two) times daily. 10/28/15   Kathlen Mody, MD  ciprofloxacin (CIPRO) 500 MG tablet Take 1 tablet (500 mg total) by mouth every 12 (twelve) hours. 12/12/16   Geoffery Lyons, MD  famotidine (PEPCID) 20 MG tablet One at bedtime 12/30/15   Nyoka Cowden, MD  guaiFENesin-dextromethorphan [facility] DM) 100-10 MG/5ML syrup Take 5 mLs by mouth every 4 (four) hours as needed for cough. 10/28/15   Kathlen Mody, MD  ipratropium-albuterol (DUONEB) 0.5-2.5 (3) MG/3ML SOLN Take 3 mLs by nebulization every 6 (six) hours as needed. 10/28/15   Kathlen Mody, MD  losartan-hydrochlorothiazide (HYZAAR) 100-25 MG tablet Take 1 tablet by mouth daily. 10/14/15   Historical Provider, MD  Multiple Vitamins-Minerals (MULTIVITAMIN ADULT) TABS Take 1 tablet by mouth daily.    Historical Provider, MD  oxyCODONE (OXY IR/ROXICODONE) 5 MG immediate release tablet Take 1 tablet (5 mg total) by mouth every 4 (four) hours as needed for moderate pain. 10/28/15   Kathlen Mody, MD  pantoprazole (PROTONIX) 40 MG tablet Take 1 tablet (40 mg total) by mouth daily. Take 30-60 min before first meal of the day 12/30/15   Nyoka Cowden, MD  phenazopyridine (PYRIDIUM) 200 MG tablet Take 1 tablet (200 mg total) by mouth 3 (three) times daily as needed for pain. 12/12/16   Geoffery Lyons, MD  predniSONE (DELTASONE) 10 MG tablet Take  4 each am x 2 days,   2 each am x 2 days,  1 each am x 2 days and stop 12/30/15   Nyoka Cowden, MD  traMADol Janean Sark) 50 MG tablet 1-2 every 4 hours as needed for cough or pain 12/30/15   Nyoka Cowden, MD  vitamin E (VITAMIN E) 400 UNIT capsule Take 400 Units by mouth daily.    Historical Provider, MD    Family History History reviewed. No pertinent family history.  Social History Social History  Substance Use Topics  . Smoking status: Never Smoker  . Smokeless tobacco: Never Used  . Alcohol use Yes     Comment: Once a month     Allergies   Ace inhibitors and Latex   Review of Systems Review  of Systems  Constitutional: Negative for chills and fever.  HENT: Negative for ear pain and sore throat.   Eyes: Negative for pain and visual disturbance.  Respiratory: Negative for cough and shortness of breath.   Cardiovascular: Positive for chest pain. Negative for palpitations.  Gastrointestinal: Negative for abdominal pain and vomiting.  Genitourinary: Negative for dysuria and hematuria.  Musculoskeletal: Negative for arthralgias and back pain.  Skin: Negative for color change and rash.  Neurological: Positive for light-headedness. Negative for seizures, syncope and numbness.  All other systems reviewed and are negative.    Physical Exam Updated Vital Signs BP 108/73   Pulse 68   Temp 98.2 F (36.8 C) (Oral)   Resp 16   Ht 4\' 11"  (1.499 m)   Wt 79.4 kg   SpO2 100%   BMI 35.35 kg/m   Physical Exam  Constitutional: She appears well-developed and well-nourished. No distress.  HENT:  Head: Normocephalic and atraumatic.  Eyes: Conjunctivae are normal.  Neck: Neck supple.  Cardiovascular: Normal rate, regular rhythm, S1 normal, S2 normal and normal heart sounds.  Exam reveals no gallop and no friction rub.   No murmur heard. Pulmonary/Chest: Effort normal and breath sounds normal. No respiratory distress. She exhibits tenderness. She exhibits no deformity.    Abdominal: Soft. There is no tenderness.  Musculoskeletal: She exhibits no edema.  Neurological: She is alert.  Skin: Skin is warm and dry.  Psychiatric: She has a normal mood and affect.  Nursing note and vitals reviewed.    ED Treatments / Results  Labs (all labs ordered are listed, but only abnormal results are displayed) Labs Reviewed  COMPREHENSIVE METABOLIC PANEL - Abnormal; Notable for the following:       Result Value   Potassium 3.4 (*)    Chloride 100 (*)    Glucose, Bld 111 (*)    Total Protein 6.3 (*)    All other components within normal limits  CBC  I-STAT TROPOININ, ED  I-STAT TROPOININ,  ED   Negative troponinx2  EKG  EKG Interpretation  Date/Time:  Tuesday March 05 2017 10:50:54 EDT Ventricular Rate:  70 PR Interval:  150 QRS Duration: 84 QT Interval:  404 QTC Calculation: 436 R Axis:   43 Text Interpretation:  Normal sinus rhythm Normal ECG Normal ECG Confirmed by Gerhard Munch  MD ([id number]) on 03/05/2017 11:13:50 AM       Radiology Dg Chest 2 View  Result Date: 03/05/2017 CLINICAL DATA:  Chest pain, shortness of breath. EXAM: CHEST  2 VIEW COMPARISON:  Radiographs of October 25, 2015. FINDINGS: The heart size and mediastinal contours are within normal limits. Both lungs are clear. No pneumothorax or pleural effusion is noted. The  visualized skeletal structures are unremarkable. IMPRESSION: No active cardiopulmonary disease. Electronically Signed   By: Lupita RaiderJames  Green Jr, M.D.   On: 03/05/2017 12:19    Procedures Procedures (including critical care time)  Medications Ordered in ED Medications  morphine 4 MG/ML injection 2 mg (2 mg Intravenous Given 03/05/17 1154)     Initial Impression / Assessment and Plan / ED Course  I have reviewed the triage vital signs and the nursing notes.  Pertinent labs & imaging results that were available during my care of the patient were reviewed by me and considered in my medical decision making (see chart for details).   Patient re-evaluated around 1230, she reports that the morphine took her pain down to a 5/10.  She reports is more comfortable.  Based on heart score of 3, she is low risk however a second troponin is ordered for 1530 as that will be 6 hours since her pain started.  Repeat troponin , taken 6 hours since onset of symptoms,  Was negative.  Patient reports her pain is better.   Patient is to be discharged with recommendation to follow up with PCP in regards to today's hospital visit. Chest pain is not likely of cardiac or pulmonary etiology d/t presentation, PERC negative, VSS, no tracheal deviation, no JVD or  new murmur, RRR, breath sounds equal bilaterally, EKG without acute abnormalities, negative troponin, and negative CXR. Pt has been advised to return to the ED if CP becomes exertional, associated with diaphoresis or nausea, radiates to left jaw/arm, worsens or becomes concerning in any way. Pt appears reliable for follow up and is agreeable to discharge.   Case has been discussed with and seen by Dr. Jeraldine LootsLockwood who agrees with the above plan to discharge.      Final Clinical Impressions(s) / ED Diagnoses   Final diagnoses:  Atypical chest pain    New Prescriptions New Prescriptions   No medications on file     Cristina Gonglizabeth W Ryleigh Esqueda, [place] 03/05/17 1610    Gerhard Munchobert Lockwood, MD 03/06/17 [phone]

## 2017-03-13 ENCOUNTER — Other Ambulatory Visit: Payer: Self-pay | Admitting: Internal Medicine

## 2017-03-13 DIAGNOSIS — Z1231 Encounter for screening mammogram for malignant neoplasm of breast: Secondary | ICD-10-CM

## 2017-03-29 ENCOUNTER — Ambulatory Visit
Admission: RE | Admit: 2017-03-29 | Discharge: 2017-03-29 | Disposition: A | Discharge status: Home / Self Care | Payer: BLUE CROSS/BLUE SHIELD | Source: Ambulatory Visit | Attending: Internal Medicine | Admitting: Internal Medicine

## 2017-03-29 DIAGNOSIS — Z1231 Encounter for screening mammogram for malignant neoplasm of breast: Secondary | ICD-10-CM

## 2018-07-22 ENCOUNTER — Emergency Department (HOSPITAL_COMMUNITY)
Admission: EM | Admit: 2018-07-22 | Discharge: 2018-07-23 | Disposition: A | Discharge status: Home / Self Care | Payer: BLUE CROSS/BLUE SHIELD | Attending: Emergency Medicine | Admitting: Emergency Medicine

## 2018-07-22 ENCOUNTER — Other Ambulatory Visit: Payer: Self-pay

## 2018-07-22 ENCOUNTER — Encounter (HOSPITAL_COMMUNITY): Payer: Self-pay

## 2018-07-22 DIAGNOSIS — Z7982 Long term (current) use of aspirin: Secondary | ICD-10-CM | POA: Diagnosis not present

## 2018-07-22 DIAGNOSIS — J209 Acute bronchitis, unspecified: Secondary | ICD-10-CM | POA: Diagnosis not present

## 2018-07-22 DIAGNOSIS — I10 Essential (primary) hypertension: Secondary | ICD-10-CM | POA: Diagnosis not present

## 2018-07-22 DIAGNOSIS — R05 Cough: Secondary | ICD-10-CM | POA: Diagnosis present

## 2018-07-22 DIAGNOSIS — Z91040 Latex allergy status: Secondary | ICD-10-CM | POA: Insufficient documentation

## 2018-07-22 DIAGNOSIS — R112 Nausea with vomiting, unspecified: Secondary | ICD-10-CM | POA: Diagnosis not present

## 2018-07-22 DIAGNOSIS — Z79899 Other long term (current) drug therapy: Secondary | ICD-10-CM | POA: Insufficient documentation

## 2018-07-22 NOTE — ED Triage Notes (Signed)
Pt presents to ED from home for cough and emesis. Pt reports that she had a "stomach bug" over the weekend, but is still vomiting occasionally. Pt reports a productive cough.

## 2018-07-22 NOTE — ED Provider Notes (Signed)
[facility]-EMERGENCY DEPT Provider Note   CSN: [id number] Arrival date & time: 07/22/18  2147     History   Chief Complaint Chief Complaint  Patient presents with  . Cough  . Emesis    HPI Doris Jones is a 52 y.o. female.  Patient presents to the emergency department for evaluation of cough.  Patient reports that she has been sick for the last several days.  It started as a "stomach bug".  Patient had nausea and vomiting but this has resolved.  She is not tolerating oral intake.  Patient has developed a harsh and productive cough.  She reports that she has a history of recurrent bronchitis.  When she gets sick like this she requires breathing treatments.  She has only, however, done 2 treatments over the last 2 days, none today.     Past Medical History:  Diagnosis Date  . HTN (hypertension) 10/24/2015  . Hypertension     Patient Active Problem List   Diagnosis Date Noted  . Upper airway cough syndrome 12/30/2015  . Leukocytosis 10/25/2015  . Acute hypokalemia 10/24/2015  . Acute bronchitis with bronchospasm 10/24/2015  . HTN (hypertension) 10/24/2015  . Sinus tachycardia 10/24/2015  . Hypokalemia 10/24/2015    Past Surgical History:  Procedure Laterality Date  . ABDOMINAL HYSTERECTOMY    . TUBAL LIGATION       OB History   None      Home Medications    Prior to Admission medications   Medication Sig Start Date End Date Taking? Authorizing Provider  amLODipine (NORVASC) 5 MG tablet Take 5 mg by mouth daily. 10/14/15  Yes [provider]  aspirin 81 MG chewable tablet Chew 81 mg by mouth daily.   Yes [provider]  chlorpheniramine-HYDROcodone (TUSSIONEX) 10-8 MG/5ML SUER Take 5 mLs by mouth 2 (two) times daily. 10/28/15  Yes Kathlen ModyAkula, Vijaya, MD  ibuprofen (ADVIL,MOTRIN) 200 MG tablet Take 800 mg by mouth every 6 (six) hours as needed for moderate pain.   Yes [provider]  ipratropium-albuterol (DUONEB)  0.5-2.5 (3) MG/3ML SOLN Take 3 mLs by nebulization every 6 (six) hours as needed. Patient taking differently: Take 3 mLs by nebulization every 6 (six) hours as needed (wheezing).  10/28/15  Yes Kathlen ModyAkula, Vijaya, MD  Multiple Vitamins-Minerals (MULTIVITAMIN ADULT) TABS Take 1 tablet by mouth daily.   Yes [provider]  phentermine 37.5 MG capsule Take 37.5 mg by mouth every morning.   Yes [provider]  valsartan-hydrochlorothiazide (DIOVAN-HCT) 160-25 MG tablet Take 1 tablet by mouth daily.   Yes [provider]  vitamin E (VITAMIN E) 400 UNIT capsule Take 400 Units by mouth daily.   Yes [provider]  benzonatate (TESSALON) 100 MG capsule Take 1 capsule (100 mg total) by mouth 3 (three) times daily as needed for cough. 07/23/18   Pollina, Canary Brimhristopher J, MD  HYDROcodone-homatropine (HYCODAN) 5-1.5 MG/5ML syrup Take 5 mLs by mouth every 6 (six) hours as needed for cough. 07/23/18   Gilda CreasePollina, Christopher J, MD  predniSONE (DELTASONE) 20 MG tablet Take 2 tablets (40 mg total) by mouth daily with breakfast. 07/23/18   Pollina, Canary Brimhristopher J, MD    Family History History reviewed. No pertinent family history.  Social History Social History   Tobacco Use  . Smoking status: Never Smoker  . Smokeless tobacco: Never Used  Substance Use Topics  . Alcohol use: Yes    Comment: Once a month  . Drug use: No  Allergies   Ace inhibitors and Latex   Review of Systems Review of Systems  Respiratory: Positive for cough, shortness of breath and wheezing.   Gastrointestinal: Positive for nausea and vomiting.  All other systems reviewed and are negative.    Physical Exam Updated Vital Signs BP 115/79 (BP Location: Left Arm)   Pulse 99   Temp 98.8 F (37.1 C) (Oral)   Resp 16   Ht 5\' 2"  (1.575 m)   Wt 76.2 kg (168 lb)   SpO2 98%   BMI 30.73 kg/m   Physical Exam  Constitutional: She is oriented to person, place, and time. She appears well-developed and  well-nourished. No distress.  HENT:  Head: Normocephalic and atraumatic.  Right Ear: Hearing normal.  Left Ear: Hearing normal.  Nose: Nose normal.  Mouth/Throat: Oropharynx is clear and moist and mucous membranes are normal.  Eyes: Pupils are equal, round, and reactive to light. Conjunctivae and EOM are normal.  Neck: Normal range of motion. Neck supple.  Cardiovascular: Regular rhythm, S1 normal and S2 normal. Tachycardia present. Exam reveals no gallop and no friction rub.  No murmur heard. Pulmonary/Chest: Effort normal. No respiratory distress. She has decreased breath sounds. She has wheezes. She exhibits no tenderness.  Abdominal: Soft. Normal appearance and bowel sounds are normal. There is no hepatosplenomegaly. There is no tenderness. There is no rebound, no guarding, no tenderness at McBurney's point and negative Murphy's sign. No hernia.  Musculoskeletal: Normal range of motion.  Neurological: She is alert and oriented to person, place, and time. She has normal strength. No cranial nerve deficit or sensory deficit. Coordination normal. GCS eye subscore is 4. GCS verbal subscore is 5. GCS motor subscore is 6.  Skin: Skin is warm, dry and intact. No rash noted. No cyanosis.  Psychiatric: She has a normal mood and affect. Her speech is normal and behavior is normal. Thought content normal.  Nursing note and vitals reviewed.    ED Treatments / Results  Labs (all labs ordered are listed, but only abnormal results are displayed) Labs Reviewed  URINALYSIS, ROUTINE W REFLEX MICROSCOPIC - Abnormal; Notable for the following components:      Result Value   APPearance HAZY (*)    Protein, ur 30 (*)    All other components within normal limits    EKG None  Radiology Dg Chest 2 View  Result Date: 07/23/2018 CLINICAL DATA:  Constant productive cough since July 19, 2018. EXAM: CHEST - 2 VIEW COMPARISON:  03/05/2017 FINDINGS: The heart size and mediastinal contours are within normal  limits. Mild prominence and perihilar interstitial lung markings likely reflecting viral mediated small airway inflammation. No alveolar consolidation, effusion or pneumothorax. The visualized skeletal structures are unremarkable. IMPRESSION: Likely viral mediated small airway inflammation accounting for slight prominence of perihilar interstitial lung markings. Electronically Signed   By: Tollie Eth M.D.   On: 07/23/2018 00:52    Procedures Procedures (including critical care time)  Medications Ordered in ED Medications  predniSONE (DELTASONE) tablet 60 mg (has no administration in time range)  guaiFENesin-codeine 100-10 MG/5ML solution 10 mL (has no administration in time range)  ipratropium-albuterol (DUONEB) 0.5-2.5 (3) MG/3ML nebulizer solution 3 mL (has no administration in time range)  ipratropium-albuterol (DUONEB) 0.5-2.5 (3) MG/3ML nebulizer solution 3 mL (3 mLs Nebulization Given 07/23/18 0058)  albuterol (PROVENTIL) (2.5 MG/3ML) 0.083% nebulizer solution 2.5 mg (2.5 mg Nebulization Given 07/23/18 0058)     Initial Impression / Assessment and Plan / ED Course  I  have reviewed the triage vital signs and the nursing notes.  Pertinent labs & imaging results that were available during my care of the patient were reviewed by me and considered in my medical decision making (see chart for details).     Patient presents to the emergency department for evaluation of cough and difficulty breathing.  She reports a history of recurrent bronchitis requiring bronchodilator therapy.  She has only used her nebulizer twice over the last 3 days, none today.  She did have bronchospasm present on arrival.  Chest x-ray does not show pneumonia.  Patient has had some improvement with albuterol and Atrovent.  Patient reports nausea and vomiting earlier in the week that has resolved.  Urinalysis does not suggest infection.  Final Clinical Impressions(s) / ED Diagnoses   Final diagnoses:  Acute  bronchitis, unspecified organism    ED Discharge Orders        Ordered    benzonatate (TESSALON) 100 MG capsule  3 times daily PRN     07/23/18 0146    HYDROcodone-homatropine (HYCODAN) 5-1.5 MG/5ML syrup  Every 6 hours PRN     07/23/18 0146    predniSONE (DELTASONE) 20 MG tablet  Daily with breakfast     07/23/18 0146       Gilda Crease, MD 07/23/18 [phone]

## 2018-07-23 ENCOUNTER — Emergency Department (HOSPITAL_COMMUNITY): Payer: BLUE CROSS/BLUE SHIELD

## 2018-07-23 LAB — URINALYSIS, ROUTINE W REFLEX MICROSCOPIC
BACTERIA UA: NONE SEEN
BILIRUBIN URINE: NEGATIVE
Glucose, UA: NEGATIVE mg/dL
HGB URINE DIPSTICK: NEGATIVE
KETONES UR: NEGATIVE mg/dL
LEUKOCYTES UA: NEGATIVE
NITRITE: NEGATIVE
PROTEIN: 30 mg/dL — AB
Specific Gravity, Urine: 1.021 (ref 1.005–1.030)
pH: 6 (ref 5.0–8.0)

## 2018-07-23 MED ORDER — GUAIFENESIN-CODEINE 100-10 MG/5ML PO SOLN
10.0000 mL | Freq: Once | ORAL | Status: AC
  Administered 2018-07-23: 10 mL via ORAL
  Filled 2018-07-23: qty 10

## 2018-07-23 MED ORDER — BENZONATATE 100 MG PO CAPS: 100.0000 mg | ORAL_CAPSULE | Freq: Three times a day (TID) | ORAL | 0 refills | Status: DC | PRN

## 2018-07-23 MED ORDER — HYDROCODONE-HOMATROPINE 5-1.5 MG/5ML PO SYRP: 5.0000 mL | ORAL_SOLUTION | Freq: Four times a day (QID) | ORAL | 0 refills | Status: AC | PRN

## 2018-07-23 MED ORDER — PREDNISONE 20 MG PO TABS: 40.0000 mg | ORAL_TABLET | Freq: Every day | ORAL | 0 refills | Status: DC

## 2018-07-23 MED ORDER — PREDNISONE 20 MG PO TABS
60.0000 mg | ORAL_TABLET | Freq: Once | ORAL | Status: AC
  Administered 2018-07-23: 60 mg via ORAL
  Filled 2018-07-23: qty 3

## 2018-07-23 MED ORDER — IPRATROPIUM-ALBUTEROL 0.5-2.5 (3) MG/3ML IN SOLN
3.0000 mL | Freq: Once | RESPIRATORY_TRACT | Status: AC
  Administered 2018-07-23: 3 mL via RESPIRATORY_TRACT
  Filled 2018-07-23: qty 3

## 2018-07-23 MED ORDER — IPRATROPIUM-ALBUTEROL 0.5-2.5 (3) MG/3ML IN SOLN: 3.0000 mL | Freq: Four times a day (QID) | RESPIRATORY_TRACT | 2 refills | Status: AC | PRN

## 2018-07-23 MED ORDER — ALBUTEROL SULFATE (2.5 MG/3ML) 0.083% IN NEBU
2.5000 mg | INHALATION_SOLUTION | Freq: Once | RESPIRATORY_TRACT | Status: AC
  Administered 2018-07-23: 2.5 mg via RESPIRATORY_TRACT
  Filled 2018-07-23: qty 3

## 2018-07-23 NOTE — ED Notes (Signed)
Pt had one episode of vomiting after taking medication

## 2018-09-13 IMAGING — CR DG CHEST 2V
2 series · 2 of 2 positions shown · non-contrast
Comparison: Radiographs October 25, 2015.

CLINICAL DATA: Chest pain, shortness of breath.

EXAM:
CHEST  2 VIEW

[chest pa]
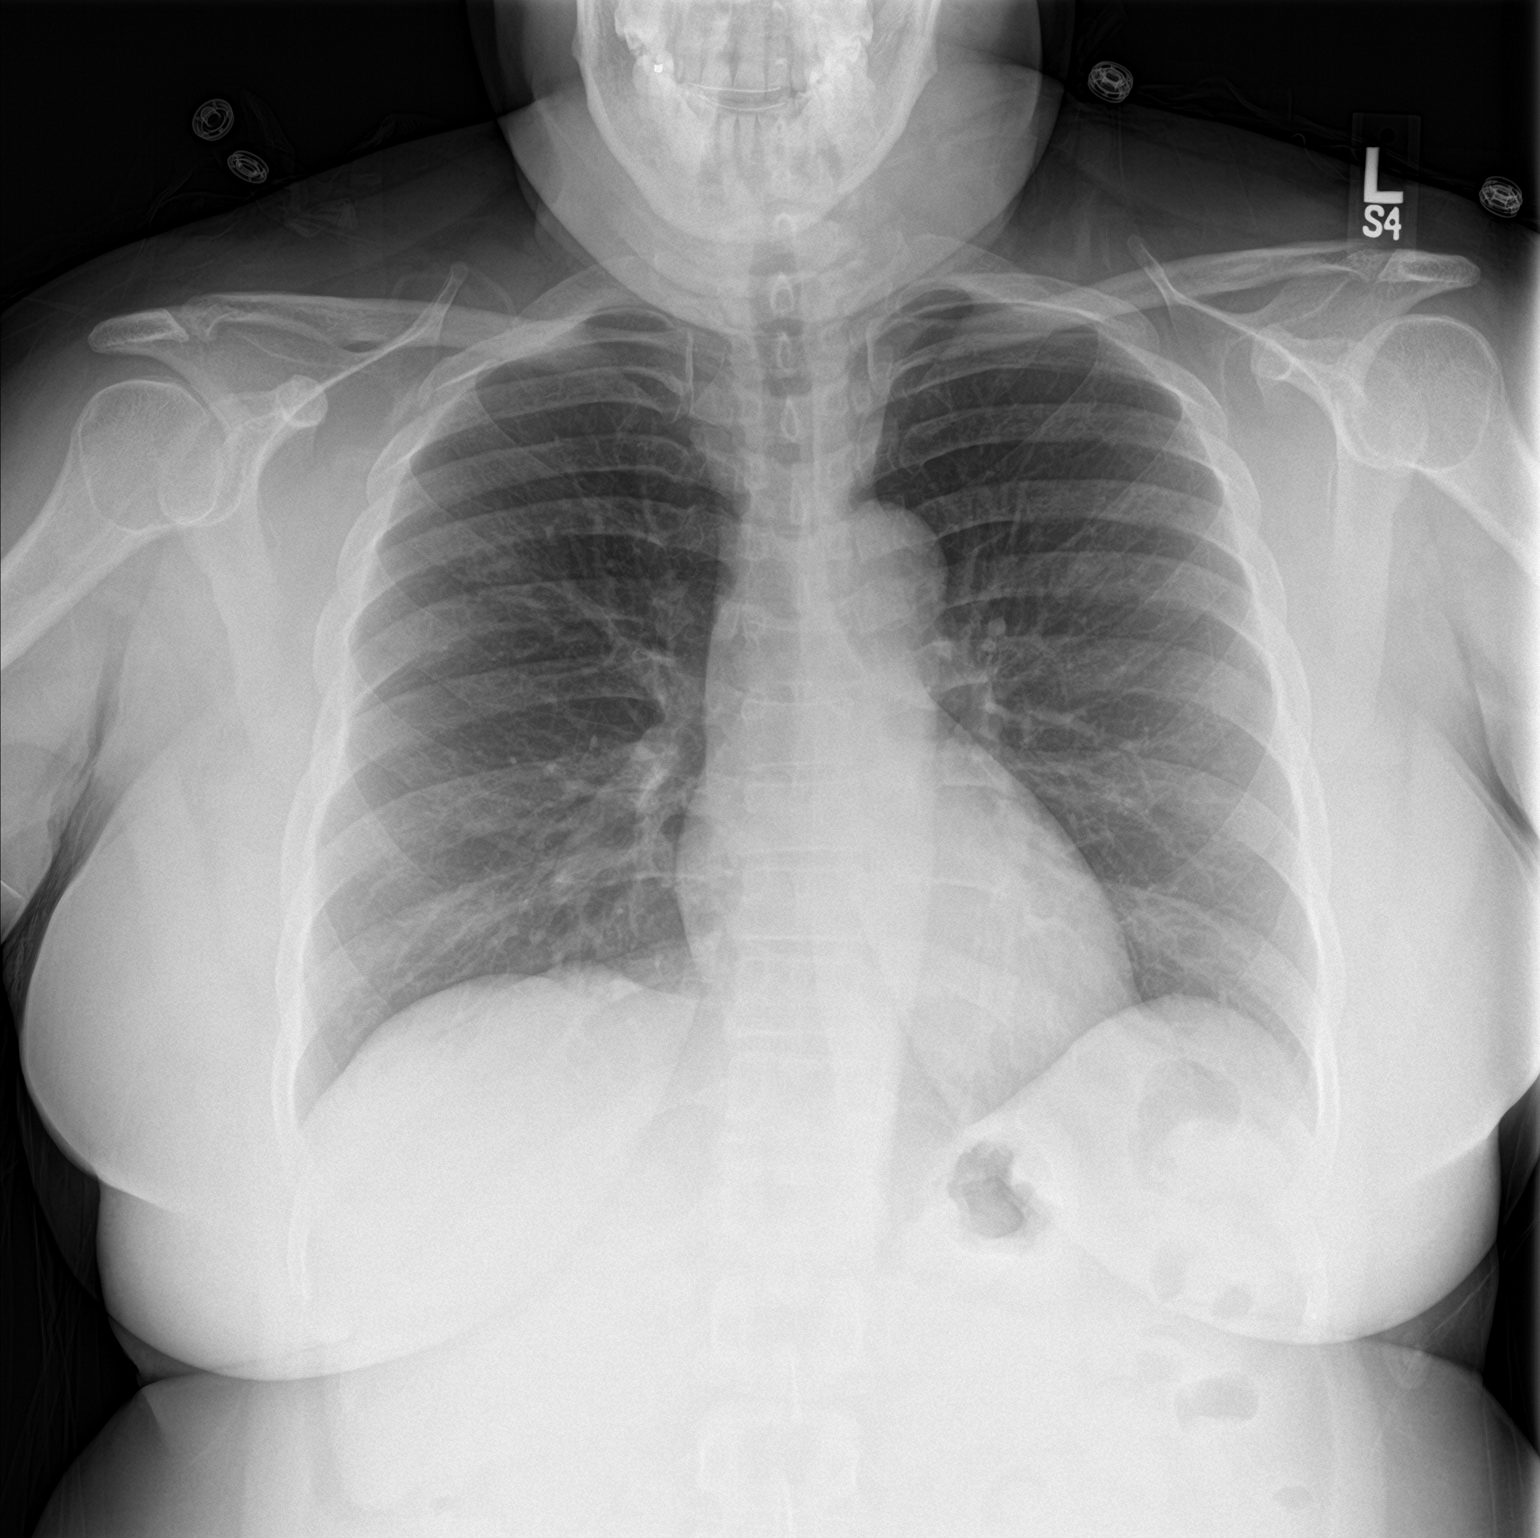

[chest lat]
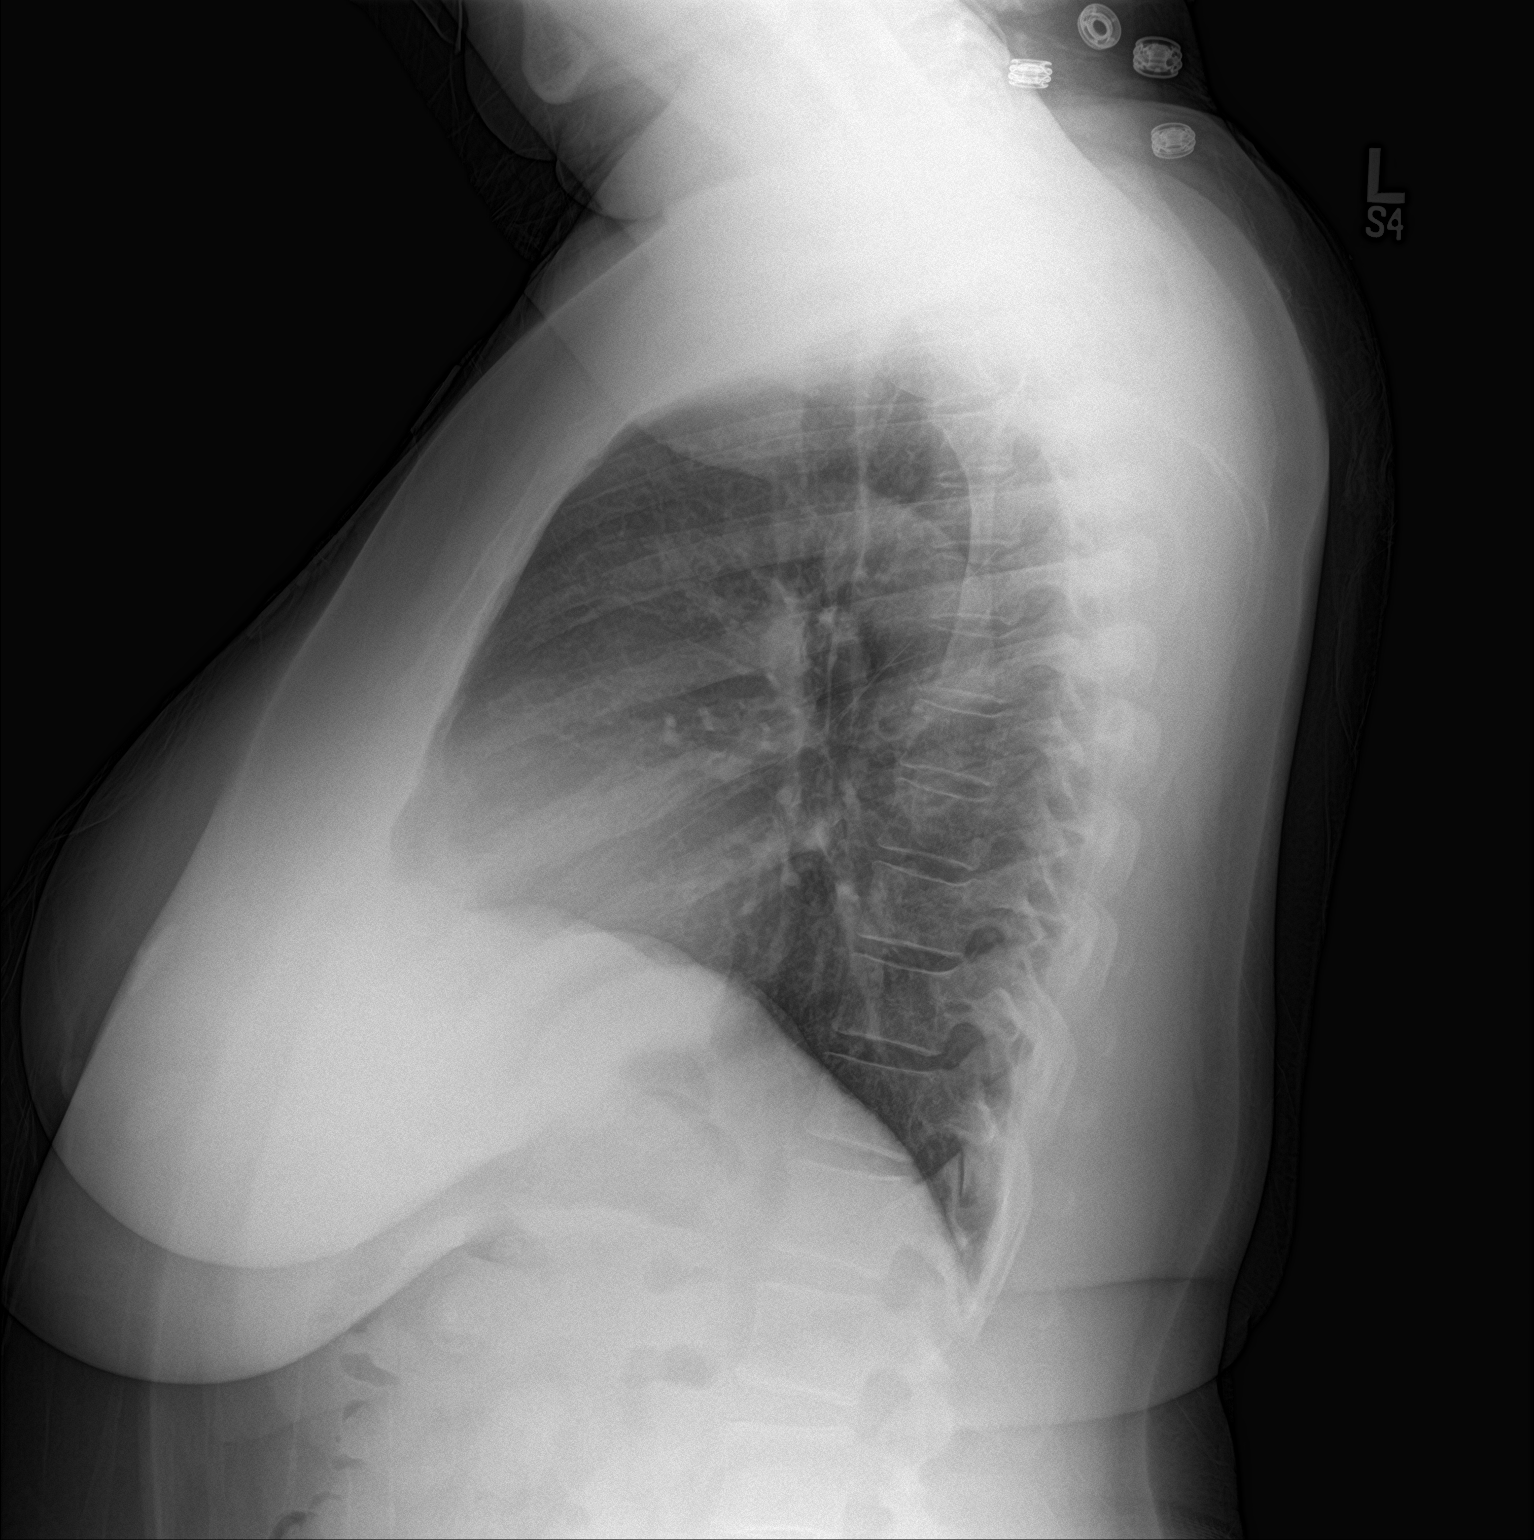

[2 of 2 positions shown; findings below may reference images not displayed]

FINDINGS: The heart size and mediastinal contours are within normal limits.
Both lungs are clear. No pneumothorax or pleural effusion is noted.
The visualized skeletal structures are unremarkable.
IMPRESSION: No active cardiopulmonary disease.

## 2019-02-09 ENCOUNTER — Encounter (HOSPITAL_COMMUNITY): Payer: Self-pay

## 2019-02-09 ENCOUNTER — Emergency Department (HOSPITAL_COMMUNITY)
Admission: EM | Admit: 2019-02-09 | Discharge: 2019-02-09 | Disposition: A | Discharge status: Home / Self Care | Payer: BLUE CROSS/BLUE SHIELD | Attending: Emergency Medicine | Admitting: Emergency Medicine

## 2019-02-09 ENCOUNTER — Emergency Department (HOSPITAL_COMMUNITY): Payer: BLUE CROSS/BLUE SHIELD

## 2019-02-09 DIAGNOSIS — J4521 Mild intermittent asthma with (acute) exacerbation: Secondary | ICD-10-CM | POA: Diagnosis not present

## 2019-02-09 DIAGNOSIS — J40 Bronchitis, not specified as acute or chronic: Secondary | ICD-10-CM

## 2019-02-09 DIAGNOSIS — Z79899 Other long term (current) drug therapy: Secondary | ICD-10-CM | POA: Diagnosis not present

## 2019-02-09 DIAGNOSIS — R05 Cough: Secondary | ICD-10-CM | POA: Diagnosis present

## 2019-02-09 DIAGNOSIS — R0602 Shortness of breath: Secondary | ICD-10-CM | POA: Diagnosis not present

## 2019-02-09 DIAGNOSIS — I10 Essential (primary) hypertension: Secondary | ICD-10-CM | POA: Diagnosis not present

## 2019-02-09 DIAGNOSIS — Z7982 Long term (current) use of aspirin: Secondary | ICD-10-CM | POA: Diagnosis not present

## 2019-02-09 HISTORY — DX: Unspecified asthma, uncomplicated: J45.909

## 2019-02-09 LAB — I-STAT TROPONIN, ED: Troponin i, poc: 0.01 ng/mL (ref 0.00–0.08)

## 2019-02-09 LAB — CBC WITH DIFFERENTIAL/PLATELET
Abs Immature Granulocytes: 0.01 10*3/uL (ref 0.00–0.07)
BASOS ABS: 0 10*3/uL (ref 0.0–0.1)
Basophils Relative: 1 %
Eosinophils Absolute: 0.1 10*3/uL (ref 0.0–0.5)
Eosinophils Relative: 1 %
HCT: 40.1 % (ref 36.0–46.0)
Hemoglobin: 13.6 g/dL (ref 12.0–15.0)
Immature Granulocytes: 0 %
Lymphocytes Relative: 34 %
Lymphs Abs: 2.1 10*3/uL (ref 0.7–4.0)
MCH: 29.2 pg (ref 26.0–34.0)
MCHC: 33.9 g/dL (ref 30.0–36.0)
MCV: 86.2 fL (ref 80.0–100.0)
Monocytes Absolute: 0.8 10*3/uL (ref 0.1–1.0)
Monocytes Relative: 13 %
Neutro Abs: 3.1 10*3/uL (ref 1.7–7.7)
Neutrophils Relative %: 51 %
Platelets: 328 10*3/uL (ref 150–400)
RBC: 4.65 MIL/uL (ref 3.87–5.11)
RDW: 13.7 % (ref 11.5–15.5)
WBC: 6 10*3/uL (ref 4.0–10.5)
nRBC: 0 % (ref 0.0–0.2)

## 2019-02-09 LAB — BASIC METABOLIC PANEL
Anion gap: 12 (ref 5–15)
BUN: 13 mg/dL (ref 6–20)
CO2: 26 mmol/L (ref 22–32)
Calcium: 9.5 mg/dL (ref 8.9–10.3)
Chloride: 101 mmol/L (ref 98–111)
Creatinine, Ser: 0.9 mg/dL (ref 0.44–1.00)
GFR calc non Af Amer: >60 mL/min (ref >60)
Glucose, Bld: 126 mg/dL — ABNORMAL HIGH (ref 70–99)
Potassium: 3 mmol/L — ABNORMAL LOW (ref 3.5–5.1)
Sodium: 139 mmol/L (ref 135–145)

## 2019-02-09 LAB — BRAIN NATRIURETIC PEPTIDE: B Natriuretic Peptide: 10.1 pg/mL (ref 0.0–100.0)

## 2019-02-09 MED ORDER — ALBUTEROL SULFATE HFA 108 (90 BASE) MCG/ACT IN AERS
2.0000 | INHALATION_SPRAY | Freq: Once | RESPIRATORY_TRACT | Status: AC
  Administered 2019-02-09: 2 via RESPIRATORY_TRACT
  Filled 2019-02-09: qty 6.7

## 2019-02-09 MED ORDER — PREDNISONE 10 MG PO TABS: 40.0000 mg | ORAL_TABLET | Freq: Every day | ORAL | 0 refills | Status: AC

## 2019-02-09 MED ORDER — POTASSIUM CHLORIDE CRYS ER 20 MEQ PO TBCR
40.0000 meq | EXTENDED_RELEASE_TABLET | Freq: Once | ORAL | Status: AC
  Administered 2019-02-09: 40 meq via ORAL
  Filled 2019-02-09: qty 2

## 2019-02-09 MED ORDER — PREDNISONE 20 MG PO TABS
60.0000 mg | ORAL_TABLET | Freq: Once | ORAL | Status: AC
  Administered 2019-02-09: 60 mg via ORAL
  Filled 2019-02-09: qty 3

## 2019-02-09 MED ORDER — ALBUTEROL SULFATE (2.5 MG/3ML) 0.083% IN NEBU
5.0000 mg | INHALATION_SOLUTION | Freq: Once | RESPIRATORY_TRACT | Status: AC
  Administered 2019-02-09: 5 mg via RESPIRATORY_TRACT
  Filled 2019-02-09: qty 6

## 2019-02-09 MED ORDER — BENZONATATE 100 MG PO CAPS: 100.0000 mg | ORAL_CAPSULE | Freq: Three times a day (TID) | ORAL | 0 refills | Status: DC

## 2019-02-09 MED ORDER — IPRATROPIUM-ALBUTEROL 0.5-2.5 (3) MG/3ML IN SOLN
3.0000 mL | Freq: Once | RESPIRATORY_TRACT | Status: AC
  Administered 2019-02-09: 3 mL via RESPIRATORY_TRACT
  Filled 2019-02-09: qty 3

## 2019-02-09 MED ORDER — POTASSIUM CHLORIDE CRYS ER 20 MEQ PO TBCR
20.0000 meq | EXTENDED_RELEASE_TABLET | Freq: Two times a day (BID) | ORAL | Status: DC
  Administered 2019-02-09: 20 meq via ORAL
  Filled 2019-02-09: qty 1

## 2019-02-09 NOTE — ED Provider Notes (Signed)
[facility]-EMERGENCY DEPT Provider Note   CSN: [id number] Arrival date & time: 02/09/19  9924    History   Chief Complaint Chief Complaint  Patient presents with  . Shortness of Breath  . Cough    HPI Doris Jones is a 52 y.o. female.     HPI   Cough began 2/5, given augmentin initially  Went to PCP, got steroid shot and antibiotic shot but not steroids to go home with  Feels like cough is worsening, coughing up a lot of thick phlegm, developing nausea, low appetite. Is having shortness of breath and wheezing.  Inhaler helps some but gives her a headache. Has hx of asthma. No hx of smoking.  Slight temperature, was getting cold and hot.  Feels like front of chest has pain, taking ibuprofen which helps. Also has back pain. Cough worse with deep breaths.  Nausea and vomiting started over the weekend.  Has had congestion. No leg pain or swelling, no hx of DVT/PE, not on estrogens, had hysterectomy, no hx of long trips, recent surgeries.   Past Medical History:  Diagnosis Date  . Asthma   . HTN (hypertension) 10/24/2015  . Hypertension     Patient Active Problem List   Diagnosis Date Noted  . Upper airway cough syndrome 12/30/2015  . Leukocytosis 10/25/2015  . Acute hypokalemia 10/24/2015  . Acute bronchitis with bronchospasm 10/24/2015  . HTN (hypertension) 10/24/2015  . Sinus tachycardia 10/24/2015  . Hypokalemia 10/24/2015    Past Surgical History:  Procedure Laterality Date  . ABDOMINAL HYSTERECTOMY    . TUBAL LIGATION       OB History   No obstetric history on file.      Home Medications    Prior to Admission medications   Medication Sig Start Date End Date Taking? Authorizing Provider  Albuterol Sulfate (PROAIR HFA IN) Inhale 2 puffs into the lungs every 4 (four) hours as needed (wheezing or shortness of breath).   Yes [provider]  amLODipine (NORVASC) 5 MG tablet Take 5 mg by mouth daily. 10/14/15  Yes [provider]  aspirin 81 MG chewable tablet Chew 81 mg by mouth daily.   Yes [provider]  HYDROcodone-homatropine (HYCODAN) 5-1.5 MG/5ML syrup Take 5 mLs by mouth every 6 (six) hours as needed for cough. 07/23/18  Yes Pollina, Canary Brim, MD  ibuprofen (ADVIL,MOTRIN) 200 MG tablet Take 800 mg by mouth every 6 (six) hours as needed for moderate pain.   Yes [provider]  ipratropium-albuterol (DUONEB) 0.5-2.5 (3) MG/3ML SOLN Take 3 mLs by nebulization every 6 (six) hours as needed (wheezing). 07/23/18  Yes Pollina, Canary Brim, MD  Multiple Vitamins-Minerals (MULTIVITAMIN ADULT) TABS Take 1 tablet by mouth daily.   Yes [provider]  oxyCODONE-acetaminophen (PERCOCET/ROXICET) 5-325 MG tablet Take 1 tablet by mouth daily as needed. 11/03/18  Yes [provider]  valsartan-hydrochlorothiazide (DIOVAN-HCT) 160-25 MG tablet Take 1 tablet by mouth daily.   Yes [provider]  vitamin E (VITAMIN E) 400 UNIT capsule Take 400 Units by mouth daily.   Yes [provider]  benzonatate (TESSALON) 100 MG capsule Take 1 capsule (100 mg total) by mouth every 8 (eight) hours. 02/09/19   Alvira Monday, MD  chlorpheniramine-HYDROcodone (TUSSIONEX) 10-8 MG/5ML SUER Take 5 mLs by mouth 2 (two) times daily. Patient not taking: Reported on 02/09/2019 10/28/15   Kathlen Mody, MD  predniSONE (DELTASONE) 10 MG tablet Take 4 tablets (40 mg total) by mouth daily  for 4 days. 02/09/19 02/13/19  Alvira Monday, MD    Family History History reviewed. No pertinent family history.  Social History Social History   Tobacco Use  . Smoking status: Never Smoker  . Smokeless tobacco: Never Used  Substance Use Topics  . Alcohol use: Yes    Comment: Once a month  . Drug use: No     Allergies   Ace inhibitors and Latex   Review of Systems Review of Systems  Constitutional: Positive for appetite change and fatigue. Negative for fever.  HENT: Negative for  sore throat.   Eyes: Negative for visual disturbance.  Respiratory: Positive for cough, shortness of breath and wheezing.   Cardiovascular: Negative for chest pain.  Gastrointestinal: Positive for nausea and vomiting. Negative for abdominal pain, constipation and diarrhea.  Genitourinary: Negative for difficulty urinating.  Musculoskeletal: Negative for back pain and neck pain.  Skin: Negative for rash.  Neurological: Negative for syncope and headaches.     Physical Exam Updated Vital Signs BP 119/84 (BP Location: Right Arm)   Pulse 72   Temp 98.4 F (36.9 C) (Oral)   Resp 20   SpO2 100%   Physical Exam Vitals signs and nursing note reviewed.  Constitutional:      General: She is not in acute distress.    Appearance: She is well-developed. She is not diaphoretic.     Comments: Frequent bronchitic cough  HENT:     Head: Normocephalic and atraumatic.  Eyes:     Conjunctiva/sclera: Conjunctivae normal.  Neck:     Musculoskeletal: Normal range of motion.  Cardiovascular:     Rate and Rhythm: Normal rate and regular rhythm.     Heart sounds: Normal heart sounds. No murmur. No friction rub. No gallop.   Pulmonary:     Effort: Pulmonary effort is normal. No respiratory distress.     Breath sounds: Wheezing (occasional) present. No rales.  Abdominal:     General: There is no distension.     Palpations: Abdomen is soft.     Tenderness: There is no abdominal tenderness. There is no guarding.  Musculoskeletal:        General: No tenderness.  Skin:    General: Skin is warm and dry.     Findings: No erythema or rash.  Neurological:     Mental Status: She is alert and oriented to person, place, and time.      ED Treatments / Results  Labs (all labs ordered are listed, but only abnormal results are displayed) Labs Reviewed  BASIC METABOLIC PANEL - Abnormal; Notable for the following components:      Result Value   Potassium 3.0 (*)    Glucose, Bld 126 (*)    All other  components within normal limits  BRAIN NATRIURETIC PEPTIDE  CBC WITH DIFFERENTIAL/PLATELET  I-STAT TROPONIN, ED    EKG EKG Interpretation  Date/Time:  Monday February 09 2019 08:56:59 EST Ventricular Rate:  84 PR Interval:    QRS Duration: 84 QT Interval:  350 QTC Calculation: 414 R Axis:   44 Text Interpretation:  Sinus rhythm No significant change since last tracing Confirmed by Alvira Monday ([id number]) on 02/09/2019 9:30:57 AM   Radiology Dg Chest 2 View  Result Date: 02/09/2019 CLINICAL DATA:  Shortness of breath. EXAM: CHEST - 2 VIEW COMPARISON:  Radiographs of July 23, 2018. FINDINGS: The heart size and mediastinal contours are within normal limits. Both lungs are clear. No pneumothorax or pleural effusion is noted. The visualized skeletal  structures are unremarkable. IMPRESSION: No active cardiopulmonary disease. Electronically Signed   By: Lupita Raider, M.D.   On: 02/09/2019 09:24    Procedures Procedures (including critical care time)  Medications Ordered in ED Medications  albuterol (PROVENTIL) (2.5 MG/3ML) 0.083% nebulizer solution 5 mg (5 mg Nebulization Given 02/09/19 0857)  predniSONE (DELTASONE) tablet 60 mg (60 mg Oral Given 02/09/19 1002)  ipratropium-albuterol (DUONEB) 0.5-2.5 (3) MG/3ML nebulizer solution 3 mL (3 mLs Nebulization Given 02/09/19 1003)  albuterol (PROVENTIL HFA;VENTOLIN HFA) 108 (90 Base) MCG/ACT inhaler 2 puff (2 puffs Inhalation Given 02/09/19 1003)  potassium chloride SA (K-DUR,KLOR-CON) CR tablet 40 mEq (40 mEq Oral Given 02/09/19 1200)     Initial Impression / Assessment and Plan / ED Course  I have reviewed the triage vital signs and the nursing notes.  Pertinent labs & imaging results that were available during my care of the patient were reviewed by me and considered in my medical decision making (see chart for details).       53yo female with history of asthma and hypertension presents with concern for shortness of breath and  cough.  EKG without acute findings. Troponin negative, doubt ACS. Has chest wall and back tenderness and suspect CP musculoskeletal from coughing. CXR shows no sign of pneumonia nor pneumothorax.  No sign of CHF.  Doubt PE given no tachycardia, no hypoxia, no risk factors for PE.  Suspect given history of significant productive cough she has acute bronchitis and asthma.   Given K for mild hypokalemia, prednisone and albuterol.  Given rx for prednisone, tessalon pearls, and recommend continued monitoring of symptoms and discussed reasons to return.    Final Clinical Impressions(s) / ED Diagnoses   Final diagnoses:  Bronchitis  Intermittent asthma with acute exacerbation, unspecified asthma severity    ED Discharge Orders         Ordered    predniSONE (DELTASONE) 10 MG tablet  Daily     02/09/19 1351    benzonatate (TESSALON) 100 MG capsule  Every 8 hours     02/09/19 1351           Alvira Monday, MD 02/09/19 2101

## 2019-02-09 NOTE — ED Notes (Addendum)
Patient repositioned

## 2019-02-09 NOTE — ED Notes (Signed)
Lab called to add on BMP.

## 2019-02-09 NOTE — ED Triage Notes (Addendum)
Patient c/o cough and shob that started on the 5th. Patient went to primary care doctor and told she has an upper respiratory infection and given amoxicillin.  Patient went back to doctor on the 18th and was given steroid shots.  Patient vomiting during triage. Nausea and vomiting started yesterday. Patient states she gets nauseas when she gets "too hot".     A/Ox4 Ambulatory.   Audible expiratory wheezes.  Hx. Asthma

## 2019-02-09 NOTE — ED Notes (Addendum)
Patient vomiting at this time in room. Patient given emesis bag. Unable to start breathing treatment.

## 2019-02-09 NOTE — ED Notes (Signed)
Patient ambulated to bathroom with no assist and no problems. Patient requested to provide urine sample.  

## 2019-07-10 ENCOUNTER — Other Ambulatory Visit: Payer: Self-pay | Admitting: Internal Medicine

## 2019-07-10 DIAGNOSIS — Z1231 Encounter for screening mammogram for malignant neoplasm of breast: Secondary | ICD-10-CM

## 2021-06-13 ENCOUNTER — Other Ambulatory Visit: Payer: Self-pay | Admitting: Orthopedic Surgery

## 2021-07-07 NOTE — Progress Notes (Signed)
Surgical Instructions    Your procedure is scheduled on Thursday July 28th.  Report to [facility] Main Entrance "A" at 9 A.M., then check in with the Admitting office.  Call this number if you have problems the morning of surgery:  [phone]   If you have any questions prior to your surgery date call [phone]: Open Monday-Friday 8am-4pm    Remember:  Do not eat after midnight the night before your surgery  You may drink clear liquids until 12pm the day of your surgery.   Clear liquids allowed are: Water, Non-Citrus Juices (without pulp), Carbonated Beverages, Clear Tea, Black Coffee Only, and Gatorade   Enhanced Recovery after Surgery  Enhanced Recovery after Surgery is a protocol used to improve the stress on your body and your recovery after surgery.  Patient Instructions  The day of surgery (if you do NOT have diabetes):  Drink ONE (1) Pre-Surgery Clear Ensure by ___12__ pm the morning of surgery   This drink was given to you during your hospital  pre-op appointment visit. Nothing else to drink after completing the  Pre-Surgery Clear Ensure.          If you have questions, please contact your surgeon's office.     Take these medicines the morning of surgery with A SIP OF WATER  amLODipine (NORVASC) 5 MG tablet chlorpheniramine-HYDROcodone (TUSSIONEX) 10-8 MG/5ML SUER  IF NEEDED ipratropium-albuterol (DUONEB) 0.5-2.5 (3) MG/3ML SOLN  Stop taking Phentermine until after surgery  As of today, STOP taking any Aspirin (unless otherwise instructed by your surgeon), Voltaren, Aleve, Naproxen, Ibuprofen, Motrin, Advil, Goody's, BC's, all herbal medications, fish oil, and all vitamins.          Do not wear jewelry or makeup Do not wear lotions, powders, perfumes, or deodorant. Do not shave 48 hours prior to surgery.   Do not bring valuables to the hospital. DO Not wear nail polish, gel polish, artificial nails, or any other type of covering on natural nails including  finger and toenails. If patients have artificial nails, gel coating, etc. that need to be removed by a nail salon please have this removed prior to surgery or surgery may need to be canceled/delayed if the surgeon/ anesthesia feels like the patient is unable to be adequately monitored.             [facility] is not responsible for any belongings or valuables.  Do NOT Smoke (Tobacco/Vaping) or drink Alcohol 24 hours prior to your procedure If you use a CPAP at night, you may bring all equipment for your overnight stay.   Contacts, glasses, dentures or bridgework may not be worn into surgery, please bring cases for these belongings   For patients admitted to the hospital, discharge time will be determined by your treatment team.   Patients discharged the day of surgery will not be allowed to drive home, and someone needs to stay with them for 24 hours.  ONLY 1 SUPPORT PERSON MAY BE PRESENT WHILE YOU ARE IN SURGERY. IF YOU ARE TO BE ADMITTED ONCE YOU ARE IN YOUR ROOM YOU WILL BE ALLOWED TWO (2) VISITORS.  Minor children may have two parents present. Special consideration for safety and communication needs will be reviewed on a case by case basis.  Special instructions:    Oral Hygiene is also important to reduce your risk of infection.  Remember - BRUSH YOUR TEETH THE MORNING OF SURGERY WITH YOUR REGULAR TOOTHPASTE   [facility]- Preparing For Surgery  Before surgery,  you can play an important role. Because skin is not sterile, your skin needs to be as free of germs as possible. You can reduce the number of germs on your skin by washing with CHG (chlorahexidine gluconate) Soap before surgery.  CHG is an antiseptic cleaner which kills germs and bonds with the skin to continue killing germs even after washing.     Please do not use if you have an allergy to CHG or antibacterial soaps. If your skin becomes reddened/irritated stop using the CHG.  Do not shave (including legs and underarms) for  at least 48 hours prior to first CHG shower. It is OK to shave your face.  Please follow these instructions carefully.     Shower the NIGHT BEFORE SURGERY and the MORNING OF SURGERY with CHG Soap.   If you chose to wash your hair, wash your hair first as usual with your normal shampoo. After you shampoo, rinse your hair and body thoroughly to remove the shampoo.  Then [employer] and genitals (private parts) with your normal soap and rinse thoroughly to remove soap.  After that Use CHG Soap as you would any other liquid soap. You can apply CHG directly to the skin and wash gently with a scrungie or a clean washcloth.   Apply the CHG Soap to your body ONLY FROM THE NECK DOWN.  Do not use on open wounds or open sores. Avoid contact with your eyes, ears, mouth and genitals (private parts). Wash Face and genitals (private parts)  with your normal soap.   Wash thoroughly, paying special attention to the area where your surgery will be performed.  Thoroughly rinse your body with warm water from the neck down.  DO NOT shower/wash with your normal soap after using and rinsing off the CHG Soap.  Pat yourself dry with a CLEAN TOWEL.  Wear CLEAN PAJAMAS to bed the night before surgery  Place CLEAN SHEETS on your bed the night before your surgery  DO NOT SLEEP WITH PETS.   Day of Surgery:  Take a shower with CHG soap. Wear Clean/Comfortable clothing the morning of surgery Do not apply any deodorants/lotions.   Remember to brush your teeth WITH YOUR REGULAR TOOTHPASTE.   Please read over the following fact sheets that you were given.

## 2021-07-10 ENCOUNTER — Encounter (HOSPITAL_COMMUNITY): Payer: Self-pay

## 2021-07-10 ENCOUNTER — Other Ambulatory Visit: Payer: Self-pay

## 2021-07-10 ENCOUNTER — Encounter (HOSPITAL_COMMUNITY)
Admission: RE | Admit: 2021-07-10 | Discharge: 2021-07-10 | Disposition: A | Discharge status: Home / Self Care | Payer: Worker's Compensation | Source: Ambulatory Visit | Attending: Orthopedic Surgery | Admitting: Orthopedic Surgery

## 2021-07-10 DIAGNOSIS — Z20822 Contact with and (suspected) exposure to covid-19: Secondary | ICD-10-CM | POA: Insufficient documentation

## 2021-07-10 DIAGNOSIS — Z01818 Encounter for other preprocedural examination: Secondary | ICD-10-CM | POA: Insufficient documentation

## 2021-07-10 HISTORY — DX: Other specified postprocedural states: R11.2

## 2021-07-10 HISTORY — DX: Other specified postprocedural states: Z98.890

## 2021-07-10 HISTORY — DX: Unspecified osteoarthritis, unspecified site: M19.90

## 2021-07-10 LAB — COMPREHENSIVE METABOLIC PANEL
ALT: 34 U/L (ref 0–44)
AST: 30 U/L (ref 15–41)
Albumin: 4 g/dL (ref 3.5–5.0)
Alkaline Phosphatase: 75 U/L (ref 38–126)
Anion gap: 7 (ref 5–15)
BUN: 15 mg/dL (ref 6–20)
CO2: 26 mmol/L (ref 22–32)
Calcium: 9.5 mg/dL (ref 8.9–10.3)
Chloride: 109 mmol/L (ref 98–111)
Creatinine, Ser: 0.63 mg/dL (ref 0.44–1.00)
GFR, Estimated: >60 mL/min (ref >60)
Glucose, Bld: 104 mg/dL — ABNORMAL HIGH (ref 70–99)
Potassium: 2.8 mmol/L — ABNORMAL LOW (ref 3.5–5.1)
Sodium: 142 mmol/L (ref 135–145)
Total Bilirubin: 0.6 mg/dL (ref 0.3–1.2)
Total Protein: 6.6 g/dL (ref 6.5–8.1)

## 2021-07-10 LAB — PROTIME-INR
INR: 1.1 (ref 0.8–1.2)
Prothrombin Time: 13.9 seconds (ref 11.4–15.2)

## 2021-07-10 LAB — CBC WITH DIFFERENTIAL/PLATELET
Abs Immature Granulocytes: 0.02 10*3/uL (ref 0.00–0.07)
Basophils Absolute: 0 10*3/uL (ref 0.0–0.1)
Basophils Relative: 1 %
Eosinophils Absolute: 0.3 10*3/uL (ref 0.0–0.5)
Eosinophils Relative: 3 %
HCT: 31.9 % — ABNORMAL LOW (ref 36.0–46.0)
Hemoglobin: 11 g/dL — ABNORMAL LOW (ref 12.0–15.0)
Immature Granulocytes: 0 %
Lymphocytes Relative: 38 %
Lymphs Abs: 3.1 10*3/uL (ref 0.7–4.0)
MCH: 30 pg (ref 26.0–34.0)
MCHC: 34.5 g/dL (ref 30.0–36.0)
MCV: 86.9 fL (ref 80.0–100.0)
Monocytes Absolute: 0.5 10*3/uL (ref 0.1–1.0)
Monocytes Relative: 6 %
Neutro Abs: 4.3 10*3/uL (ref 1.7–7.7)
Neutrophils Relative %: 52 %
Platelets: 326 10*3/uL (ref 150–400)
RBC: 3.67 MIL/uL — ABNORMAL LOW (ref 3.87–5.11)
RDW: 13.6 % (ref 11.5–15.5)
WBC: 8.2 10*3/uL (ref 4.0–10.5)
nRBC: 0 % (ref 0.0–0.2)

## 2021-07-10 LAB — URINALYSIS, ROUTINE W REFLEX MICROSCOPIC
Bilirubin Urine: NEGATIVE
Glucose, UA: NEGATIVE mg/dL
Hgb urine dipstick: NEGATIVE
Ketones, ur: NEGATIVE mg/dL
Leukocytes,Ua: NEGATIVE
Nitrite: NEGATIVE
Protein, ur: NEGATIVE mg/dL
Specific Gravity, Urine: 1.021 (ref 1.005–1.030)
pH: 6 (ref 5.0–8.0)

## 2021-07-10 LAB — TYPE AND SCREEN
ABO/RH(D): B POS
Antibody Screen: NEGATIVE

## 2021-07-10 LAB — SURGICAL PCR SCREEN
MRSA, PCR: NEGATIVE
Staphylococcus aureus: POSITIVE — AB

## 2021-07-10 LAB — APTT: aPTT: 27 seconds (ref 24–36)

## 2021-07-10 LAB — SARS CORONAVIRUS 2 (TAT 6-24 HRS): SARS Coronavirus 2: NEGATIVE

## 2021-07-10 NOTE — Progress Notes (Signed)
Surgical Instructions    Your procedure is scheduled on Thursday July 28th.  Report to [facility] Main Entrance "A" at 9 A.M., then check in with the Admitting office.  Call this number if you have problems the morning of surgery:  [phone]   If you have any questions prior to your surgery date call [phone]: Open Monday-Friday 8am-4pm    Remember:  Do not eat after midnight the night before your surgery  You may drink clear liquids until 9am the day of your surgery.   Clear liquids allowed are: Water, Non-Citrus Juices (without pulp), Carbonated Beverages, Clear Tea, Black Coffee Only, and Gatorade  Patient Instructions  The night before surgery:  No food after midnight. ONLY clear liquids after midnight  The day of surgery (if you do NOT have diabetes):  Drink ONE (1) Pre-Surgery Clear Ensure by 9am the morning of surgery. Drink in one sitting. Do not sip.  This drink was given to you during your hospital  pre-op appointment visit. Nothing else to drink after completing the  Pre-Surgery Clear Ensure.          If you have questions, please contact your surgeon's office.            If you have questions, please contact your surgeon's office.     Take these medicines the morning of surgery with A SIP OF WATER  amLODipine (NORVASC)  chlorpheniramine-HYDROcodone (TUSSIONEX)   IF NEEDED ipratropium-albuterol (DUONEB)   Stop taking Phentermine until after surgery  As of today, STOP taking any Aspirin (unless otherwise instructed by your surgeon), Voltaren, Aleve, Naproxen, Ibuprofen, Motrin, Advil, Goody's, BC's, all herbal medications, fish oil, and all vitamins.          Do not wear jewelry or makeup Do not wear lotions, powders, perfumes, or deodorant. Do not shave 48 hours prior to surgery.   Do not bring valuables to the hospital.  DO Not wear nail polish, gel polish, artificial nails, or any other type of covering on natural nails  including finger and  toenails. If patients have artificial nails, gel coating, etc. that need to be removed by a nail salon please have this removed prior to surgery or surgery may need to be canceled/delayed if the surgeon/ anesthesia feels like the patient is unable to be adequately monitored.             [facility] is not responsible for any belongings or valuables.  Do NOT Smoke (Tobacco/Vaping) or drink Alcohol 24 hours prior to your procedure If you use a CPAP at night, you may bring all equipment for your overnight stay.   Contacts, glasses, dentures or bridgework may not be worn into surgery, please bring cases for these belongings   For patients admitted to the hospital, discharge time will be determined by your treatment team.   Patients discharged the day of surgery will not be allowed to drive home, and someone needs to stay with them for 24 hours.  ONLY 1 SUPPORT PERSON MAY BE PRESENT WHILE YOU ARE IN SURGERY. IF YOU ARE TO BE ADMITTED ONCE YOU ARE IN YOUR ROOM YOU WILL BE ALLOWED TWO (2) VISITORS.  Minor children may have two parents present. Special consideration for safety and communication needs will be reviewed on a case by case basis.  Special instructions:    Oral Hygiene is also important to reduce your risk of infection.  Remember - BRUSH YOUR TEETH THE MORNING OF SURGERY WITH YOUR REGULAR TOOTHPASTE  [place]- Preparing For Surgery  Before surgery, you can play an important role. Because skin is not sterile, your skin needs to be as free of germs as possible. You can reduce the number of germs on your skin by washing with CHG (chlorahexidine gluconate) Soap before surgery.  CHG is an antiseptic cleaner which kills germs and bonds with the skin to continue killing germs even after washing.     Please do not use if you have an allergy to CHG or antibacterial soaps. If your skin becomes reddened/irritated stop using the CHG.  Do not shave (including legs and underarms) for at least 48  hours prior to first CHG shower. It is OK to shave your face.  Please follow these instructions carefully.     Shower the NIGHT BEFORE SURGERY and the MORNING OF SURGERY with CHG Soap.   If you chose to wash your hair, wash your hair first as usual with your normal shampoo. After you shampoo, rinse your hair and body thoroughly to remove the shampoo.  Then [employer] and genitals (private parts) with your normal soap and rinse thoroughly to remove soap.  After that Use CHG Soap as you would any other liquid soap. You can apply CHG directly to the skin and wash gently with a scrungie or a clean washcloth.   Apply the CHG Soap to your body ONLY FROM THE NECK DOWN.  Do not use on open wounds or open sores. Avoid contact with your eyes, ears, mouth and genitals (private parts). Wash Face and genitals (private parts)  with your normal soap.   Wash thoroughly, paying special attention to the area where your surgery will be performed.  Thoroughly rinse your body with warm water from the neck down.  DO NOT shower/wash with your normal soap after using and rinsing off the CHG Soap.  Pat yourself dry with a CLEAN TOWEL.  Wear CLEAN PAJAMAS to bed the night before surgery  Place CLEAN SHEETS on your bed the night before your surgery  DO NOT SLEEP WITH PETS.   Day of Surgery: Take a shower with CHG soap. Wear Clean/Comfortable clothing the morning of surgery Do not apply any deodorants/lotions.   Remember to brush your teeth WITH YOUR REGULAR TOOTHPASTE.   Please read over the following fact sheets that you were given.

## 2021-07-10 NOTE — Progress Notes (Signed)
PCP - Salli Real at [facility] - denies   PPM/ICD - denies   Chest x-ray - n/a EKG - 07/10/21 Stress Test - over 15 years ago and normal per patient ECHO - denies Cardiac Cath - denies  Sleep Study - denies CPAP -   No diabetes  Stop taking Phentermine until after surgery   As of today, STOP taking any Aspirin (unless otherwise instructed by your surgeon), Voltaren, Aleve, Naproxen, Ibuprofen, Motrin, Advil, Goody's, BC's, all herbal medications, fish oil, and all vitamins.           ERAS Protcol -yes PRE-SURGERY Ensure or G2- ensure given  COVID TEST- 07/10/21 in PAT   Anesthesia review: yes, takes phentermine  Patient denies shortness of breath, fever, cough and chest pain at PAT appointment   All instructions explained to the patient, with a verbal understanding of the material. Patient agrees to go over the instructions while at home for a better understanding. Patient also instructed to self quarantine after being tested for COVID-19. The opportunity to ask questions was provided.

## 2021-07-11 NOTE — Progress Notes (Signed)
Contacted Dr. Yevette Edwards about K 2.8 during PAT appt. Left a voicemail.

## 2021-07-11 NOTE — Anesthesia Preprocedure Evaluation (Addendum)
Anesthesia Evaluation  Patient identified by MRN, date of birth, ID band Patient awake    Reviewed: Allergy & Precautions, NPO status , Patient's Chart, lab work & pertinent test results  History of Anesthesia Complications (+) PONV and history of anesthetic complications  Airway Mallampati: I  TM Distance: >3 FB Neck ROM: Full    Dental  (+) Teeth Intact, Dental Advisory Given   Pulmonary asthma ,    breath sounds clear to auscultation       Cardiovascular hypertension, Pt. on medications  Rhythm:Regular Rate:Normal     Neuro/Psych  Neuromuscular disease negative psych ROS   GI/Hepatic negative GI ROS, Neg liver ROS,   Endo/Other  negative endocrine ROS  Renal/GU negative Renal ROS     Musculoskeletal  (+) Arthritis ,   Abdominal Normal abdominal exam  (+)   Peds  Hematology negative hematology ROS (+)   Anesthesia Other Findings   Reproductive/Obstetrics                           Anesthesia Physical Anesthesia Plan  ASA: 2  Anesthesia Plan: General   Post-op Pain Management:    Induction: Intravenous  PONV Risk Score and Plan: 4 or greater and Ondansetron, Dexamethasone, Midazolam and Scopolamine patch - Pre-op  Airway Management Planned: Oral ETT  Additional Equipment: None  Intra-op Plan:   Post-operative Plan: Extubation in OR  Informed Consent: I have reviewed the patients History and Physical, chart, labs and discussed the procedure including the risks, benefits and alternatives for the proposed anesthesia with the patient or authorized representative who has indicated his/her understanding and acceptance.     Dental advisory given  Plan Discussed with: CRNA  Anesthesia Plan Comments: (PAT note by Antionette Poles, PA-C: Low potassium on preop labs, 2.8. 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 Reports this is followed by PCP Dr. Wynelle Link. States she was last  seen ~1 month ago with similarly low potassium and Dr. Chase Caller recommendation was to increase dietary potassium intake, 2 bananas per day. She reports she has not been consistent with this. She was advised to continue dietary recommendations per Dr. Wynelle Link. Dr. Yevette Edwards notified. Will recheck DOS. )   Anesthesia Quick Evaluation

## 2021-07-13 ENCOUNTER — Inpatient Hospital Stay (HOSPITAL_COMMUNITY)
Admission: RE | Disposition: A | Discharge status: Home / Self Care | Payer: Self-pay | Source: Home / Self Care | Attending: Orthopedic Surgery

## 2021-07-13 ENCOUNTER — Inpatient Hospital Stay (HOSPITAL_COMMUNITY)
Admission: RE | Admit: 2021-07-13 | Discharge: 2021-07-14 | DRG: 455 | Disposition: A | Discharge status: Home / Self Care | Payer: Worker's Compensation | Attending: Orthopedic Surgery | Admitting: Orthopedic Surgery

## 2021-07-13 ENCOUNTER — Inpatient Hospital Stay (HOSPITAL_COMMUNITY): Payer: Worker's Compensation | Admitting: Physician Assistant

## 2021-07-13 ENCOUNTER — Inpatient Hospital Stay (HOSPITAL_COMMUNITY): Payer: Worker's Compensation

## 2021-07-13 ENCOUNTER — Encounter (HOSPITAL_COMMUNITY): Payer: Self-pay | Admitting: Orthopedic Surgery

## 2021-07-13 DIAGNOSIS — Z7982 Long term (current) use of aspirin: Secondary | ICD-10-CM

## 2021-07-13 DIAGNOSIS — Z79899 Other long term (current) drug therapy: Secondary | ICD-10-CM | POA: Diagnosis not present

## 2021-07-13 DIAGNOSIS — Z20822 Contact with and (suspected) exposure to covid-19: Secondary | ICD-10-CM | POA: Diagnosis present

## 2021-07-13 DIAGNOSIS — I10 Essential (primary) hypertension: Secondary | ICD-10-CM | POA: Diagnosis not present

## 2021-07-13 DIAGNOSIS — Z90710 Acquired absence of both cervix and uterus: Secondary | ICD-10-CM | POA: Diagnosis not present

## 2021-07-13 DIAGNOSIS — M48061 Spinal stenosis, lumbar region without neurogenic claudication: Secondary | ICD-10-CM | POA: Diagnosis not present

## 2021-07-13 DIAGNOSIS — M5116 Intervertebral disc disorders with radiculopathy, lumbar region: Secondary | ICD-10-CM | POA: Diagnosis present

## 2021-07-13 DIAGNOSIS — J45909 Unspecified asthma, uncomplicated: Secondary | ICD-10-CM | POA: Diagnosis present

## 2021-07-13 DIAGNOSIS — Z419 Encounter for procedure for purposes other than remedying health state, unspecified: Secondary | ICD-10-CM

## 2021-07-13 DIAGNOSIS — M4316 Spondylolisthesis, lumbar region: Secondary | ICD-10-CM | POA: Diagnosis present

## 2021-07-13 DIAGNOSIS — M5410 Radiculopathy, site unspecified: Secondary | ICD-10-CM | POA: Diagnosis present

## 2021-07-13 HISTORY — PX: TRANSFORAMINAL LUMBAR INTERBODY FUSION (TLIF) WITH PEDICLE SCREW FIXATION 1 LEVEL: SHX6141

## 2021-07-13 LAB — BASIC METABOLIC PANEL
Anion gap: 10 (ref 5–15)
BUN: 9 mg/dL (ref 6–20)
CO2: 23 mmol/L (ref 22–32)
Calcium: 9.2 mg/dL (ref 8.9–10.3)
Chloride: 106 mmol/L (ref 98–111)
Creatinine, Ser: 0.75 mg/dL (ref 0.44–1.00)
GFR, Estimated: >60 mL/min (ref >60)
Glucose, Bld: 129 mg/dL — ABNORMAL HIGH (ref 70–99)
Potassium: 2.8 mmol/L — ABNORMAL LOW (ref 3.5–5.1)
Sodium: 139 mmol/L (ref 135–145)

## 2021-07-13 LAB — ABO/RH: ABO/RH(D): B POS

## 2021-07-13 SURGERY — TRANSFORAMINAL LUMBAR INTERBODY FUSION (TLIF) WITH PEDICLE SCREW FIXATION 1 LEVEL
Anesthesia: General | Site: Back | Laterality: Left

## 2021-07-13 MED ORDER — HYDROXYZINE HCL 50 MG/ML IM SOLN
50.0000 mg | Freq: Four times a day (QID) | INTRAMUSCULAR | Status: DC | PRN
  Administered 2021-07-13: 50 mg via INTRAMUSCULAR
  Filled 2021-07-13: qty 1

## 2021-07-13 MED ORDER — HYDROMORPHONE HCL 1 MG/ML IJ SOLN
INTRAMUSCULAR | Status: AC
  Filled 2021-07-13: qty 1

## 2021-07-13 MED ORDER — SCOPOLAMINE 1 MG/3DAYS TD PT72: 1.0000 | MEDICATED_PATCH | TRANSDERMAL | Status: DC

## 2021-07-13 MED ORDER — MIDAZOLAM HCL 2 MG/2ML IJ SOLN
INTRAMUSCULAR | Status: DC | PRN
  Administered 2021-07-13: 2 mg via INTRAVENOUS

## 2021-07-13 MED ORDER — ALUM & MAG HYDROXIDE-SIMETH 200-200-20 MG/5ML PO SUSP: 30.0000 mL | Freq: Four times a day (QID) | ORAL | Status: DC | PRN

## 2021-07-13 MED ORDER — POVIDONE-IODINE 7.5 % EX SOLN: Freq: Once | CUTANEOUS | Status: DC

## 2021-07-13 MED ORDER — METHOCARBAMOL 1000 MG/10ML IJ SOLN
500.0000 mg | Freq: Four times a day (QID) | INTRAVENOUS | Status: DC | PRN
  Filled 2021-07-13: qty 5

## 2021-07-13 MED ORDER — HYDROCODONE-ACETAMINOPHEN 5-325 MG PO TABS
ORAL_TABLET | ORAL | Status: AC
  Filled 2021-07-13: qty 1

## 2021-07-13 MED ORDER — ACETAMINOPHEN 325 MG PO TABS: 325.0000 mg | ORAL_TABLET | Freq: Once | ORAL | Status: DC | PRN

## 2021-07-13 MED ORDER — IPRATROPIUM-ALBUTEROL 0.5-2.5 (3) MG/3ML IN SOLN: 3.0000 mL | Freq: Four times a day (QID) | RESPIRATORY_TRACT | Status: DC | PRN

## 2021-07-13 MED ORDER — BUPIVACAINE LIPOSOME 1.3 % IJ SUSP
INTRAMUSCULAR | Status: DC | PRN
  Administered 2021-07-13: 20 mL

## 2021-07-13 MED ORDER — SODIUM CHLORIDE 0.9% FLUSH: 3.0000 mL | Freq: Two times a day (BID) | INTRAVENOUS | Status: DC

## 2021-07-13 MED ORDER — METHYLENE BLUE 0.5 % INJ SOLN
INTRAVENOUS | Status: AC
  Filled 2021-07-13: qty 10

## 2021-07-13 MED ORDER — METHYLENE BLUE 0.5 % INJ SOLN
INTRAVENOUS | Status: DC | PRN
  Administered 2021-07-13: 10 mL

## 2021-07-13 MED ORDER — PROPOFOL 10 MG/ML IV BOLUS
INTRAVENOUS | Status: AC
  Filled 2021-07-13: qty 20

## 2021-07-13 MED ORDER — PROPOFOL 10 MG/ML IV BOLUS
INTRAVENOUS | Status: DC | PRN
  Administered 2021-07-13: 20 mg via INTRAVENOUS
  Administered 2021-07-13: 200 mg via INTRAVENOUS

## 2021-07-13 MED ORDER — METHOCARBAMOL 500 MG PO TABS
500.0000 mg | ORAL_TABLET | Freq: Four times a day (QID) | ORAL | Status: DC | PRN
  Administered 2021-07-13 – 2021-07-14 (×2): 500 mg via ORAL
  Filled 2021-07-13: qty 1

## 2021-07-13 MED ORDER — MORPHINE SULFATE (PF) 2 MG/ML IV SOLN: 1.0000 mg | INTRAVENOUS | Status: DC | PRN

## 2021-07-13 MED ORDER — BUPIVACAINE-EPINEPHRINE (PF) 0.25% -1:200000 IJ SOLN
INTRAMUSCULAR | Status: AC
  Filled 2021-07-13: qty 30

## 2021-07-13 MED ORDER — 0.9 % SODIUM CHLORIDE (POUR BTL) OPTIME
TOPICAL | Status: DC | PRN
  Administered 2021-07-13 (×2): 1000 mL

## 2021-07-13 MED ORDER — SUGAMMADEX SODIUM 200 MG/2ML IV SOLN
INTRAVENOUS | Status: DC | PRN
  Administered 2021-07-13: 150 mg via INTRAVENOUS

## 2021-07-13 MED ORDER — SUCCINYLCHOLINE CHLORIDE 200 MG/10ML IV SOSY
PREFILLED_SYRINGE | INTRAVENOUS | Status: DC | PRN
  Administered 2021-07-13: 140 mg via INTRAVENOUS

## 2021-07-13 MED ORDER — LACTATED RINGERS IV SOLN: INTRAVENOUS | Status: DC

## 2021-07-13 MED ORDER — ACETAMINOPHEN 650 MG RE SUPP: 650.0000 mg | RECTAL | Status: DC | PRN

## 2021-07-13 MED ORDER — KETAMINE HCL 10 MG/ML IJ SOLN
INTRAMUSCULAR | Status: DC | PRN
  Administered 2021-07-13: 10 mg via INTRAVENOUS
  Administered 2021-07-13: 20 mg via INTRAVENOUS

## 2021-07-13 MED ORDER — PHENOL 1.4 % MT LIQD: 1.0000 | OROMUCOSAL | Status: DC | PRN

## 2021-07-13 MED ORDER — POTASSIUM CHLORIDE IN NACL 20-0.9 MEQ/L-% IV SOLN: INTRAVENOUS | Status: DC

## 2021-07-13 MED ORDER — OXYCODONE-ACETAMINOPHEN 5-325 MG PO TABS
1.0000 | ORAL_TABLET | ORAL | Status: DC | PRN
  Administered 2021-07-13 – 2021-07-14 (×2): 2 via ORAL
  Filled 2021-07-13 (×2): qty 2

## 2021-07-13 MED ORDER — SODIUM CHLORIDE 0.9% FLUSH: 3.0000 mL | INTRAVENOUS | Status: DC | PRN

## 2021-07-13 MED ORDER — AMLODIPINE BESYLATE 5 MG PO TABS
5.0000 mg | ORAL_TABLET | Freq: Every day | ORAL | Status: DC
  Administered 2021-07-14: 5 mg via ORAL
  Filled 2021-07-13: qty 1

## 2021-07-13 MED ORDER — FLEET ENEMA 7-19 GM/118ML RE ENEM: 1.0000 | ENEMA | Freq: Once | RECTAL | Status: DC | PRN

## 2021-07-13 MED ORDER — MENTHOL 3 MG MT LOZG: 1.0000 | LOZENGE | OROMUCOSAL | Status: DC | PRN

## 2021-07-13 MED ORDER — VITAMIN D 25 MCG (1000 UNIT) PO TABS
1000.0000 [IU] | ORAL_TABLET | Freq: Every day | ORAL | Status: DC
  Administered 2021-07-13 – 2021-07-14 (×2): 1000 [IU] via ORAL
  Filled 2021-07-13 (×2): qty 1

## 2021-07-13 MED ORDER — BUPIVACAINE LIPOSOME 1.3 % IJ SUSP
INTRAMUSCULAR | Status: AC
  Filled 2021-07-13: qty 20

## 2021-07-13 MED ORDER — ACETAMINOPHEN 10 MG/ML IV SOLN
INTRAVENOUS | Status: AC
  Filled 2021-07-13: qty 100

## 2021-07-13 MED ORDER — MEPERIDINE HCL 25 MG/ML IJ SOLN: 6.2500 mg | INTRAMUSCULAR | Status: DC | PRN

## 2021-07-13 MED ORDER — SCOPOLAMINE 1 MG/3DAYS TD PT72
MEDICATED_PATCH | TRANSDERMAL | Status: AC
  Administered 2021-07-13: 1.5 mg via TRANSDERMAL
  Filled 2021-07-13: qty 1

## 2021-07-13 MED ORDER — PROMETHAZINE HCL 25 MG/ML IJ SOLN: 6.2500 mg | INTRAMUSCULAR | Status: DC | PRN

## 2021-07-13 MED ORDER — SENNOSIDES-DOCUSATE SODIUM 8.6-50 MG PO TABS: 1.0000 | ORAL_TABLET | Freq: Every evening | ORAL | Status: DC | PRN

## 2021-07-13 MED ORDER — BUPIVACAINE-EPINEPHRINE 0.25% -1:200000 IJ SOLN
INTRAMUSCULAR | Status: DC | PRN
  Administered 2021-07-13: 10 mL
  Administered 2021-07-13: 20 mL

## 2021-07-13 MED ORDER — CEFAZOLIN SODIUM-DEXTROSE 2-4 GM/100ML-% IV SOLN
2.0000 g | Freq: Three times a day (TID) | INTRAVENOUS | Status: AC
  Administered 2021-07-13 – 2021-07-14 (×2): 2 g via INTRAVENOUS
  Filled 2021-07-13 (×2): qty 100

## 2021-07-13 MED ORDER — ACETAMINOPHEN 160 MG/5ML PO SOLN: 325.0000 mg | Freq: Once | ORAL | Status: DC | PRN

## 2021-07-13 MED ORDER — LIDOCAINE 2% (20 MG/ML) 5 ML SYRINGE
INTRAMUSCULAR | Status: DC | PRN
  Administered 2021-07-13: 60 mg via INTRAVENOUS

## 2021-07-13 MED ORDER — THROMBIN (RECOMBINANT) 20000 UNITS EX SOLR
CUTANEOUS | Status: AC
  Filled 2021-07-13: qty 20000

## 2021-07-13 MED ORDER — ZOLPIDEM TARTRATE 5 MG PO TABS: 5.0000 mg | ORAL_TABLET | Freq: Every evening | ORAL | Status: DC | PRN

## 2021-07-13 MED ORDER — CHLORHEXIDINE GLUCONATE 0.12 % MT SOLN
15.0000 mL | Freq: Once | OROMUCOSAL | Status: AC
  Administered 2021-07-13: 15 mL via OROMUCOSAL
  Filled 2021-07-13: qty 15

## 2021-07-13 MED ORDER — ACETAMINOPHEN 10 MG/ML IV SOLN
1000.0000 mg | Freq: Once | INTRAVENOUS | Status: DC | PRN
  Administered 2021-07-13: 1000 mg via INTRAVENOUS

## 2021-07-13 MED ORDER — METHOCARBAMOL 500 MG PO TABS
ORAL_TABLET | ORAL | Status: AC
  Filled 2021-07-13: qty 1

## 2021-07-13 MED ORDER — VALSARTAN-HYDROCHLOROTHIAZIDE 160-25 MG PO TABS: 1.0000 | ORAL_TABLET | Freq: Every day | ORAL | Status: DC

## 2021-07-13 MED ORDER — HYDROMORPHONE HCL 1 MG/ML IJ SOLN
0.2500 mg | INTRAMUSCULAR | Status: DC | PRN
  Administered 2021-07-13 (×2): 0.25 mg via INTRAVENOUS

## 2021-07-13 MED ORDER — KETAMINE HCL 50 MG/5ML IJ SOSY
PREFILLED_SYRINGE | INTRAMUSCULAR | Status: AC
  Filled 2021-07-13: qty 5

## 2021-07-13 MED ORDER — PHENYLEPHRINE HCL-NACL 10-0.9 MG/250ML-% IV SOLN
INTRAVENOUS | Status: DC | PRN
  Administered 2021-07-13: 25 ug/min via INTRAVENOUS

## 2021-07-13 MED ORDER — DOCUSATE SODIUM 100 MG PO CAPS
100.0000 mg | ORAL_CAPSULE | Freq: Two times a day (BID) | ORAL | Status: DC
  Administered 2021-07-13 – 2021-07-14 (×2): 100 mg via ORAL
  Filled 2021-07-13 (×2): qty 1

## 2021-07-13 MED ORDER — MIDAZOLAM HCL 2 MG/2ML IJ SOLN
INTRAMUSCULAR | Status: AC
  Filled 2021-07-13: qty 2

## 2021-07-13 MED ORDER — AMISULPRIDE (ANTIEMETIC) 5 MG/2ML IV SOLN: 10.0000 mg | Freq: Once | INTRAVENOUS | Status: DC | PRN

## 2021-07-13 MED ORDER — POTASSIUM CHLORIDE 10 MEQ/100ML IV SOLN
10.0000 meq | INTRAVENOUS | Status: AC
  Administered 2021-07-13 (×2): 10 meq via INTRAVENOUS

## 2021-07-13 MED ORDER — SODIUM CHLORIDE 0.9 % IV SOLN: 250.0000 mL | INTRAVENOUS | Status: DC

## 2021-07-13 MED ORDER — ONDANSETRON HCL 4 MG/2ML IJ SOLN
INTRAMUSCULAR | Status: DC | PRN
  Administered 2021-07-13: 4 mg via INTRAVENOUS

## 2021-07-13 MED ORDER — ROCURONIUM BROMIDE 10 MG/ML (PF) SYRINGE
PREFILLED_SYRINGE | INTRAVENOUS | Status: DC | PRN
  Administered 2021-07-13: 50 mg via INTRAVENOUS

## 2021-07-13 MED ORDER — ONDANSETRON HCL 4 MG PO TABS: 4.0000 mg | ORAL_TABLET | Freq: Four times a day (QID) | ORAL | Status: DC | PRN

## 2021-07-13 MED ORDER — PHENYLEPHRINE 40 MCG/ML (10ML) SYRINGE FOR IV PUSH (FOR BLOOD PRESSURE SUPPORT)
PREFILLED_SYRINGE | INTRAVENOUS | Status: DC | PRN
  Administered 2021-07-13: 40 ug via INTRAVENOUS
  Administered 2021-07-13: 80 ug via INTRAVENOUS

## 2021-07-13 MED ORDER — HYDROCHLOROTHIAZIDE 25 MG PO TABS
25.0000 mg | ORAL_TABLET | Freq: Every day | ORAL | Status: DC
  Administered 2021-07-13 – 2021-07-14 (×2): 25 mg via ORAL
  Filled 2021-07-13 (×2): qty 1

## 2021-07-13 MED ORDER — PHENTERMINE HCL 37.5 MG PO TABS: 37.5000 mg | ORAL_TABLET | Freq: Every day | ORAL | Status: DC

## 2021-07-13 MED ORDER — FENTANYL CITRATE (PF) 250 MCG/5ML IJ SOLN
INTRAMUSCULAR | Status: DC | PRN
  Administered 2021-07-13: 75 ug via INTRAVENOUS
  Administered 2021-07-13: 25 ug via INTRAVENOUS
  Administered 2021-07-13: 50 ug via INTRAVENOUS

## 2021-07-13 MED ORDER — THROMBIN 20000 UNITS EX SOLR
OROMUCOSAL | Status: DC | PRN
  Administered 2021-07-13: 20 mL

## 2021-07-13 MED ORDER — ALBUTEROL SULFATE HFA 108 (90 BASE) MCG/ACT IN AERS
INHALATION_SPRAY | RESPIRATORY_TRACT | Status: DC | PRN
  Administered 2021-07-13: 3 via RESPIRATORY_TRACT
  Administered 2021-07-13: 6 via RESPIRATORY_TRACT

## 2021-07-13 MED ORDER — ACETAMINOPHEN 325 MG PO TABS: 650.0000 mg | ORAL_TABLET | ORAL | Status: DC | PRN

## 2021-07-13 MED ORDER — ORAL CARE MOUTH RINSE: 15.0000 mL | Freq: Once | OROMUCOSAL | Status: AC

## 2021-07-13 MED ORDER — ONDANSETRON HCL 4 MG/2ML IJ SOLN
4.0000 mg | Freq: Four times a day (QID) | INTRAMUSCULAR | Status: DC | PRN
  Filled 2021-07-13: qty 2

## 2021-07-13 MED ORDER — CEFAZOLIN SODIUM-DEXTROSE 2-4 GM/100ML-% IV SOLN
2.0000 g | INTRAVENOUS | Status: AC
  Administered 2021-07-13: 2 g via INTRAVENOUS
  Filled 2021-07-13: qty 100

## 2021-07-13 MED ORDER — FENTANYL CITRATE (PF) 250 MCG/5ML IJ SOLN
INTRAMUSCULAR | Status: AC
  Filled 2021-07-13: qty 5

## 2021-07-13 MED ORDER — IRBESARTAN 150 MG PO TABS
150.0000 mg | ORAL_TABLET | Freq: Every day | ORAL | Status: DC
  Administered 2021-07-13 – 2021-07-14 (×2): 150 mg via ORAL
  Filled 2021-07-13 (×2): qty 1

## 2021-07-13 MED ORDER — HYDROCODONE-ACETAMINOPHEN 5-325 MG PO TABS
1.0000 | ORAL_TABLET | ORAL | Status: DC | PRN
  Administered 2021-07-13 – 2021-07-14 (×2): 1 via ORAL
  Filled 2021-07-13: qty 1

## 2021-07-13 MED ORDER — BISACODYL 5 MG PO TBEC: 5.0000 mg | DELAYED_RELEASE_TABLET | Freq: Every day | ORAL | Status: DC | PRN

## 2021-07-13 SURGICAL SUPPLY — 76 items
BAG COUNTER SPONGE SURGICOUNT (BAG) ×2 IMPLANT
BENZOIN TINCTURE PRP APPL 2/3 (GAUZE/BANDAGES/DRESSINGS) ×2 IMPLANT
BLADE CLIPPER SURG (BLADE) ×2 IMPLANT
BONE VIVIGEN FORMABLE 10CC (Bone Implant) ×2 IMPLANT
BUR PRESCISION 1.7 ELITE (BURR) ×2 IMPLANT
BUR ROUND FLUTED 5 RND (BURR) ×2 IMPLANT
CAGE SABLE 10X26 6-12 8D (Cage) ×2 IMPLANT
CARTRIDGE OIL MAESTRO DRILL (MISCELLANEOUS) ×1 IMPLANT
CLSR STERI-STRIP ANTIMIC 1/2X4 (GAUZE/BANDAGES/DRESSINGS) ×2 IMPLANT
CNTNR URN SCR LID CUP LEK RST (MISCELLANEOUS) ×1 IMPLANT
CONT SPEC 4OZ STRL OR WHT (MISCELLANEOUS) ×1
COVER BACK TABLE 60X90IN (DRAPES) ×2 IMPLANT
COVER MAYO STAND STRL (DRAPES) ×4 IMPLANT
COVER SURGICAL LIGHT HANDLE (MISCELLANEOUS) ×2 IMPLANT
DIFFUSER DRILL AIR PNEUMATIC (MISCELLANEOUS) ×2 IMPLANT
DRAPE C-ARM 42X72 X-RAY (DRAPES) ×2 IMPLANT
DRAPE C-ARMOR (DRAPES) ×2 IMPLANT
DRAPE POUCH INSTRU U-SHP 10X18 (DRAPES) ×2 IMPLANT
DRAPE SURG 17X23 STRL (DRAPES) ×8 IMPLANT
DURAPREP 26ML APPLICATOR (WOUND CARE) ×2 IMPLANT
ELECT BLADE 4.0 EZ CLEAN MEGAD (MISCELLANEOUS) ×2
ELECT CAUTERY BLADE 6.4 (BLADE) ×2 IMPLANT
ELECT REM PT RETURN 9FT ADLT (ELECTROSURGICAL) ×2
ELECTRODE BLDE 4.0 EZ CLN MEGD (MISCELLANEOUS) ×1 IMPLANT
ELECTRODE REM PT RTRN 9FT ADLT (ELECTROSURGICAL) ×1 IMPLANT
FILTER STRAW FLUID ASPIR (MISCELLANEOUS) ×2 IMPLANT
GAUZE 4X4 16PLY ~~LOC~~+RFID DBL (SPONGE) ×2 IMPLANT
GAUZE SPONGE 4X4 12PLY STRL (GAUZE/BANDAGES/DRESSINGS) ×2 IMPLANT
GLOVE SRG 8 PF TXTR STRL LF DI (GLOVE) ×1 IMPLANT
GLOVE SURG ENC MOIS LTX SZ7 (GLOVE) ×2 IMPLANT
GLOVE SURG ENC MOIS LTX SZ8 (GLOVE) ×2 IMPLANT
GLOVE SURG UNDER POLY LF SZ7 (GLOVE) ×2 IMPLANT
GLOVE SURG UNDER POLY LF SZ8 (GLOVE) ×1
GOWN STRL REUS W/ TWL LRG LVL3 (GOWN DISPOSABLE) ×2 IMPLANT
GOWN STRL REUS W/ TWL XL LVL3 (GOWN DISPOSABLE) ×1 IMPLANT
GOWN STRL REUS W/TWL LRG LVL3 (GOWN DISPOSABLE) ×2
GOWN STRL REUS W/TWL XL LVL3 (GOWN DISPOSABLE) ×1
IV CATH 14GX2 1/4 (CATHETERS) ×2 IMPLANT
KIT BASIN OR (CUSTOM PROCEDURE TRAY) ×2 IMPLANT
KIT POSITION SURG JACKSON T1 (MISCELLANEOUS) ×2 IMPLANT
KIT TURNOVER KIT B (KITS) ×2 IMPLANT
MARKER SKIN DUAL TIP RULER LAB (MISCELLANEOUS) ×4 IMPLANT
NEEDLE 18GX1X1/2 (RX/OR ONLY) (NEEDLE) ×2 IMPLANT
NEEDLE 22X1 1/2 (OR ONLY) (NEEDLE) ×4 IMPLANT
NEEDLE HYPO 25GX1X1/2 BEV (NEEDLE) ×2 IMPLANT
NEEDLE SPNL 18GX3.5 QUINCKE PK (NEEDLE) ×4 IMPLANT
NS IRRIG 1000ML POUR BTL (IV SOLUTION) ×2 IMPLANT
OIL CARTRIDGE MAESTRO DRILL (MISCELLANEOUS) ×2
PACK LAMINECTOMY ORTHO (CUSTOM PROCEDURE TRAY) ×2 IMPLANT
PACK UNIVERSAL I (CUSTOM PROCEDURE TRAY) ×2 IMPLANT
PAD ARMBOARD 7.5X6 YLW CONV (MISCELLANEOUS) ×4 IMPLANT
PATTIES SURGICAL .5 X1 (DISPOSABLE) ×2 IMPLANT
PATTIES SURGICAL .5X1.5 (GAUZE/BANDAGES/DRESSINGS) ×2 IMPLANT
ROD PRE BENT EXPEDIUM 35MM (Rod) ×2 IMPLANT
SCREW SET SINGLE INNER (Screw) ×8 IMPLANT
SCREW VIPER CORT FIX 6.00X30 (Screw) ×8 IMPLANT
SPONGE INTESTINAL PEANUT (DISPOSABLE) ×2 IMPLANT
SPONGE SURGIFOAM ABS GEL 100 (HEMOSTASIS) ×2 IMPLANT
STRIP CLOSURE SKIN 1/2X4 (GAUZE/BANDAGES/DRESSINGS) ×4 IMPLANT
SUT MNCRL AB 4-0 PS2 18 (SUTURE) ×2 IMPLANT
SUT VIC AB 0 CT1 18XCR BRD 8 (SUTURE) ×1 IMPLANT
SUT VIC AB 0 CT1 8-18 (SUTURE) ×1
SUT VIC AB 1 CT1 18XCR BRD 8 (SUTURE) ×1 IMPLANT
SUT VIC AB 1 CT1 8-18 (SUTURE) ×1
SUT VIC AB 2-0 CT2 18 VCP726D (SUTURE) ×4 IMPLANT
SYR 20ML LL LF (SYRINGE) ×4 IMPLANT
SYR BULB IRRIG 60ML STRL (SYRINGE) ×2 IMPLANT
SYR CONTROL 10ML LL (SYRINGE) ×4 IMPLANT
SYR TB 1ML LUER SLIP (SYRINGE) ×2 IMPLANT
TAP EXPEDIUM DL 4.35 (INSTRUMENTS) ×2 IMPLANT
TAP EXPEDIUM DL 5.0 (INSTRUMENTS) ×2 IMPLANT
TAP EXPEDIUM DL 6.0 (INSTRUMENTS) ×2 IMPLANT
TAPE CLOTH SURG 4X10 WHT LF (GAUZE/BANDAGES/DRESSINGS) ×2 IMPLANT
TRAY FOLEY MTR SLVR 16FR STAT (SET/KITS/TRAYS/PACK) ×2 IMPLANT
WATER STERILE IRR 1000ML POUR (IV SOLUTION) ×2 IMPLANT
YANKAUER SUCT BULB TIP NO VENT (SUCTIONS) ×2 IMPLANT

## 2021-07-13 NOTE — H&P (Signed)
PREOPERATIVE H&P  Chief Complaint: Bilateral leg pain  HPI: Doris Jones is a 55 y.o. female who presents with ongoing pain in the bilateral legs  MRI reveals instability and spinal stenosis at L4/5  Patient has failed multiple forms of conservative care and continues to have pain (see office notes for additional details regarding the patient's full course of treatment)  Past Medical History:  Diagnosis Date   Arthritis    right knee and back per patient   Asthma    HTN (hypertension) 10/24/2015   Hypertension    PONV (postoperative nausea and vomiting)    Past Surgical History:  Procedure Laterality Date   ABDOMINAL HYSTERECTOMY     CHOLECYSTECTOMY     Tenosynovitis of Left Radial Styloid Left    TUBAL LIGATION     Social History   Socioeconomic History   Marital status: Single    Spouse name: Not on file   Number of children: Not on file   Years of education: Not on file   Highest education level: Not on file  Occupational History   Not on file  Tobacco Use   Smoking status: Never   Smokeless tobacco: Never  Vaping Use   Vaping Use: Never used  Substance and Sexual Activity   Alcohol use: Yes    Comment: Once a month   Drug use: No   Sexual activity: Not on file  Other Topics Concern   Not on file  Social History Narrative   Not on file   Social Determinants of Health   Financial Resource Strain: Not on file  Food Insecurity: Not on file  Transportation Needs: Not on file  Physical Activity: Not on file  Stress: Not on file  Social Connections: Not on file   No family history on file. Allergies  Allergen Reactions   Ace Inhibitors Cough   Latex Rash   Prior to Admission medications   Medication Sig Start Date End Date Taking? Authorizing Provider  amLODipine (NORVASC) 5 MG tablet Take 5 mg by mouth daily. 10/14/15  Yes [provider]  aspirin 81 MG chewable tablet Chew 81 mg by mouth daily.   Yes [provider]   BIOTIN PO Take 1 tablet by mouth daily.   Yes [provider]  Cholecalciferol (VITAMIN D3 PO) Take 1 tablet by mouth daily.   Yes [provider]  diclofenac Sodium (VOLTAREN) 1 % GEL Apply 2 g topically daily as needed (knee pain).   Yes [provider]  ibuprofen (ADVIL) 800 MG tablet Take 800 mg by mouth every 8 (eight) hours as needed for moderate pain.   Yes [provider]  ipratropium-albuterol (DUONEB) 0.5-2.5 (3) MG/3ML SOLN Take 3 mLs by nebulization every 6 (six) hours as needed (wheezing). 07/23/18  Yes Pollina, Canary Brim, MD  Multiple Vitamins-Minerals (MULTIVITAMIN ADULT) TABS Take 1 tablet by mouth daily.   Yes [provider]  phentermine (ADIPEX-P) 37.5 MG tablet Take 37.5 mg by mouth daily. 06/07/21  Yes [provider]  valsartan-hydrochlorothiazide (DIOVAN-HCT) 160-25 MG tablet Take 1 tablet by mouth daily.   Yes [provider]  benzonatate (TESSALON) 100 MG capsule Take 1 capsule (100 mg total) by mouth every 8 (eight) hours. Patient not taking: No sig reported 02/09/19   Alvira Monday, MD  chlorpheniramine-HYDROcodone (TUSSIONEX) 10-8 MG/5ML SUER Take 5 mLs by mouth 2 (two) times daily. 10/28/15   Kathlen Mody, MD  HYDROcodone-homatropine (HYCODAN) 5-1.5 MG/5ML syrup Take 5 mLs by  mouth every 6 (six) hours as needed for cough. Patient not taking: Reported on 07/06/2021 07/23/18   Gilda Crease, MD     All other systems have been reviewed and were otherwise negative with the exception of those mentioned in the HPI and as above.  Physical Exam: There were no vitals filed for this visit.  There is no height or weight on file to calculate BMI.  General: Alert, no acute distress Cardiovascular: No pedal edema Respiratory: No cyanosis, no use of accessory musculature Skin: No lesions in the area of chief complaint Neurologic: Sensation intact distally Psychiatric: Patient is competent for consent  with normal mood and affect Lymphatic: No axillary or cervical lymphadenopathy   Assessment/Plan: ANTEROLISTHESIS WITH SPINAL STENOSIS, L4/5 Plan for Procedure(s): LEFT-SIDED LUMBAR 4 - LUMBAR 5 TRANSFORAMINAL LUMBAR INTERBODY FUSION WITH INSTRUMENTATION AND ALLOGRAFT   Jackelyn Hoehn, MD 07/13/2021 7:56 AM

## 2021-07-13 NOTE — Op Note (Signed)
PATIENT NAME: Doris Jones   MEDICAL RECORD NO.:   [id number]   DATE OF BIRTH: 1966/10/29   DATE OF PROCEDURE: 07/13/2021                               OPERATIVE REPORT     PREOPERATIVE DIAGNOSES: 1. Bilateral lumbar radiculopathy. 2. L4-5 spinal stenosis. 3. Severe L4-5 degenerative disc disease, with an L4/5 spondylolisthesis   POSTOPERATIVE DIAGNOSES: 1. Bilateral lumbar radiculopathy. 2. L4-5 spinal stenosis. 3. Severe L4-5 degenerative disc disease, with an L4/5 spondylolisthesis   PROCEDURES: 1. L4/5 decompression 2. Left-sided L4-5 transforaminal lumbar interbody fusion. 3. Right-sided L4-5 posterolateral fusion. 4. Insertion of interbody device x1 (Globus expandable intervertebral spacer). 5. Placement of segmental posterior instrumentation L4, L5 bilaterally  6. Use of local autograft. 7. Use of morselized allograft - Vivigen 8. Intraoperative use of fluoroscopy.   SURGEON:  Estill Bamberg, MD.   ASSISTANTJason Coop, PA-C.   ANESTHESIA:  General endotracheal anesthesia.   COMPLICATIONS:  None.   DISPOSITION:  Stable.   ESTIMATED BLOOD LOSS:  100cc   INDICATIONS FOR SURGERY:  Briefly,  Doris Jones is a pleasant 55 year old female who did present to me with severe and ongoing pain in the right and left legs.  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  Since her injury, she has been having ongoing pain in her lower back, radiating into her thighs, and into her calves.  She has had extensive nonoperative measures, including oral steroids, as well as epidural injections, as well as physical therapy.  She was noted to have obvious instability at L4-5, as well as moderate to severe spinal stenosis at L4-5, and I did feel this was the finding that was very likely to be explaining her symptoms, given the location of her pain and the nature of her symptoms.  Of note, her MRI did also reveal a mass posteriorly at the L1-2 level.   The radiologist did feel this was likely secondary to a meningioma versus a nerve sheath tumor.  As I did feel that the patient's symptoms were much more likely to be secondary to the significant pathology at L4-5, I did feel that proceeding with surgery to address the L4-5 pathology initially would be the most appropriate course of action, given the severity of her pain, and that subsequent work-up of the mass at L1-2 can be performed at a later date, as I did feel this was a likely to be an incidental, very likely benign finding, and was much less likely to be correlating to the patient's ongoing pain in her back and legs.  As noted above, the patient failed conservative care and did wish to proceed with the procedure noted above.   OPERATIVE DETAILS:  On 07/13/2021, the patient was brought to surgery and general endotracheal anesthesia was administered.  The patient was placed prone on a well-padded flat Jackson bed with a spinal frame.  Antibiotics were given and a time-out procedure was performed. The back was prepped and draped in the usual fashion.  A midline incision was made overlying the L4-5 intervertebral spaces.  The fascia was incised at the midline.  The paraspinal musculature was bluntly swept laterally.  Anatomic landmarks for the pedicles were exposed. Using fluoroscopy, I did cannulate the L4 and L5 pedicles bilaterally, using a medial to lateral cortical trajectory technique.  At this point, 6 x  30 mm screws were placed into the right pedicles, and a 35 mm rod was placed into the tulip heads of the screws, and caps were also placed.  Distraction was then applied across the L4-5 intervertebral space, and the caps were then provisionally tightened.  On the left side, bone wax was placed into the cannulated pedicle holes.  I then proceeded with the decompressive aspect of the procedure at the L4-5 level.  A partial facetectomy was performed bilaterally at L4-5, decompressing the L4-5  intervertebral space.  I was very pleased with the decompression. With an assistant holding medial retraction of the traversing left L5 nerve, I did perform an annulotomy at the posterolateral aspect of the L4-5 intervertebral space.  I then used a series of curettes and pituitary rongeurs to perform a thorough and complete intervertebral diskectomy.  The intervertebral space was then liberally packed with autograft as well as allograft in the form of Vivigen, as was the appropriate-sized intervertebral spacer.  The spacer was then tamped into position in the usual fashion and gently expanded to approximately 9.5 mm in height.  I was very pleased with the press-fit of the spacer.  I then placed 6 mm screws on the left at L4 and L5. A 35-mm rod was then placed and caps were placed. The distraction was then released on the contralateral side.  All caps were then locked.  The wound was copiously irrigated with a total of approximately 3 L prior to placing the bone graft.  Additional autograft and allograft was then packed into the posterolateral gutter on the right side to help aid in the success of the fusion.  The wound was explored for any undue bleeding and there was no substantial bleeding encountered.  Gel-Foam was placed over the laminectomy site.  The wound was then closed in layers using #1 Vicryl followed by 2-0 Vicryl, followed by 4-0 Monocryl.  Benzoin and Steri-Strips were applied followed by sterile dressing.     Of note, Jason Coop was my assistant throughout surgery, and did aid in retraction, suctioning, placement of the hardware, and closure.       Estill Bamberg, MD

## 2021-07-13 NOTE — Anesthesia Procedure Notes (Signed)
Procedure Name: Intubation Date/Time: 07/13/2021 11:18 AM Performed by: Stanton Kidney, CRNA Pre-anesthesia Checklist: Patient identified, Emergency Drugs available, Suction available and Patient being monitored Patient Re-evaluated:Patient Re-evaluated prior to induction Oxygen Delivery Method: Circle system utilized Preoxygenation: Pre-oxygenation with 100% oxygen Induction Type: IV induction Ventilation: Mask ventilation without difficulty Laryngoscope Size: Miller and 3 Grade View: Grade I Tube type: Oral Tube size: 7.5 mm Number of attempts: 1 Airway Equipment and Method: Stylet and Oral airway Placement Confirmation: ETT inserted through vocal cords under direct vision, positive ETCO2 and breath sounds checked- equal and bilateral Secured at: 22 cm Tube secured with: Tape Dental Injury: Teeth and Oropharynx as per pre-operative assessment

## 2021-07-13 NOTE — Transfer of Care (Signed)
Immediate Anesthesia Transfer of Care Note  Patient: Doris Jones  Procedure(s) Performed: LEFT-SIDED LUMBAR FOUR- LUMBAR FIVE TRANSFORAMINAL LUMBAR INTERBODY FUSION WITH INSTRUMENTATION AND ALLOGRAFT (Left: Back)  Patient Location: PACU  Anesthesia Type:General  Level of Consciousness: awake, alert  and oriented  Airway & Oxygen Therapy: Patient Spontanous Breathing and Patient connected to face mask oxygen  Post-op Assessment: Report given to RN and Post -op Vital signs reviewed and stable  Post vital signs: Reviewed and stable  Last Vitals:  Vitals Value Taken Time  BP 105/65 07/13/21 1457  Temp 36.3 C 07/13/21 1455  Pulse 86 07/13/21 1502  Resp 19 07/13/21 1501  SpO2 100 % 07/13/21 1502  Vitals shown include unvalidated device data.  Last Pain:  Vitals:   07/13/21 1002  TempSrc:   PainSc: 7       Patients Stated Pain Goal: 4 (07/13/21 1002)  Complications: No notable events documented.

## 2021-07-13 NOTE — Anesthesia Postprocedure Evaluation (Signed)
Anesthesia Post Note  Patient: Doris Jones  Procedure(s) Performed: LEFT-SIDED LUMBAR FOUR- LUMBAR FIVE TRANSFORAMINAL LUMBAR INTERBODY FUSION WITH INSTRUMENTATION AND ALLOGRAFT (Left: Back)     Patient location during evaluation: PACU Anesthesia Type: General Level of consciousness: awake and alert Pain management: pain level controlled Vital Signs Assessment: post-procedure vital signs reviewed and stable Respiratory status: spontaneous breathing, nonlabored ventilation, respiratory function stable and patient connected to nasal cannula oxygen Cardiovascular status: blood pressure returned to baseline and stable Postop Assessment: no apparent nausea or vomiting Anesthetic complications: no   No notable events documented.  Last Vitals:  Vitals:   07/13/21 1655 07/13/21 1738  BP: 131/85 (!) 142/86  Pulse: (!) 55 (!) 59  Resp: 12 20  Temp:    SpO2: 100% 97%    Last Pain:  Vitals:   07/13/21 1738  TempSrc:   PainSc: 4                  Amiayah Giebel

## 2021-07-14 ENCOUNTER — Encounter (HOSPITAL_COMMUNITY): Payer: Self-pay | Admitting: Orthopedic Surgery

## 2021-07-14 MED ORDER — METHOCARBAMOL 500 MG PO TABS: 500.0000 mg | ORAL_TABLET | Freq: Four times a day (QID) | ORAL | 1 refills | Status: AC | PRN

## 2021-07-14 MED ORDER — OXYCODONE-ACETAMINOPHEN 5-325 MG PO TABS: 1.0000 | ORAL_TABLET | ORAL | 0 refills | Status: AC | PRN

## 2021-07-14 MED FILL — Thrombin (Recombinant) For Soln 20000 Unit: CUTANEOUS | Qty: 1 | Status: AC

## 2021-07-14 NOTE — Evaluation (Signed)
Physical Therapy Evaluation and Discharge Patient Details Name: Doris Jones MRN: [id number] DOB: [birth year] Today's Date: 07/14/2021   History of Present Illness  Pt is a 55 y/o female who presents s/p L sided L4/5 TLIF on 07/13/2021. PMH significant for asthma and HTN.  Clinical Impression  Patient evaluated by Physical Therapy with no further acute PT needs identified. All education has been completed and the patient has no further questions. Pt was able to demonstrate transfers and ambulation with gross modified independence and no AD. Pt was educated on precautions, brace application/wearing schedule, appropriate activity progression, and car transfer. See below for any follow-up Physical Therapy or equipment needs. PT is signing off. Thank you for this referral.     Follow Up Recommendations No PT follow up;Supervision - Intermittent    Equipment Recommendations  None recommended by PT    Recommendations for Other Services       Precautions / Restrictions Precautions Precautions: Back;Fall Precaution Booklet Issued: Yes (comment) Precaution Comments: All education reviewed, pt verbalizing understanding. Required Braces or Orthoses: Spinal Brace Spinal Brace: Thoracolumbosacral orthotic;Applied in sitting position Restrictions Weight Bearing Restrictions: No      Mobility  Bed Mobility Overal bed mobility: Modified Independent Bed Mobility: Rolling;Sidelying to Sit           General bed mobility comments: HOB flat and min use of rails. No assist required.    Transfers Overall transfer level: Modified independent Equipment used: None Transfers: Sit to/from Stand           General transfer comment: No assist required. Pt demonstrated proper hand placement on seated surface for safety.  Ambulation/Gait Ambulation/Gait assistance: Supervision Gait Distance (Feet): 300 Feet Assistive device: None Gait Pattern/deviations: Step-through pattern;Decreased stride  length;Trunk flexed Gait velocity: Decreased Gait velocity interpretation: <1.31 ft/sec, indicative of household ambulator General Gait Details: VC's for improved posture. Pt moving slow but generally steady without AD. Guarded throughout gait training but no overt LOB noted.  Stairs Stairs: Yes Stairs assistance: Supervision Stair Management: One rail Right;Step to pattern;Forwards Number of Stairs: 10 General stair comments: VC's for sequencing and general safety. No assist required.  Wheelchair Mobility    Modified Rankin (Stroke Patients Only)       Balance Overall balance assessment: Mild deficits observed, not formally tested                                           Pertinent Vitals/Pain Pain Assessment: Faces Faces Pain Scale: Hurts even more Pain Location: Incision site, R hip Pain Descriptors / Indicators: Operative site guarding;Aching;Sore Pain Intervention(s): Limited activity within patient's tolerance;Monitored during session;Repositioned    Home Living Family/patient expects to be discharged to:: Private residence Living Arrangements: Other relatives Available Help at Discharge: Family;Friend(s);Available 24 hours/day Type of Home: Apartment Home Access: Stairs to enter Entrance Stairs-Rails: Right Entrance Stairs-Number of Steps: 15 Home Layout: One level Home Equipment: Tub bench Additional Comments: Pt lives with daughter who works for [employer]    Prior Function Level of Independence: Independent         Comments: PTA, 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, broadcasting/film/video: Right    Extremity/Trunk Assessment   Upper Extremity Assessment Upper Extremity Assessment: Defer to OT evaluation    Lower Extremity Assessment Lower Extremity Assessment: Generalized weakness    Cervical / Trunk Assessment  Cervical / Trunk Assessment: Other exceptions Cervical / Trunk Exceptions: s/p surgery  Communication    Communication: No difficulties  Cognition Arousal/Alertness: Awake/alert Behavior During Therapy: WFL for tasks assessed/performed Overall Cognitive Status: Within Functional Limits for tasks assessed                                        General Comments      Exercises     Assessment/Plan    PT Assessment Patent does not need any further PT services  PT Problem List         PT Treatment Interventions      PT Goals (Current goals can be found in the Care Plan section)  Acute Rehab PT Goals Patient Stated Goal: Decrease pain PT Goal Formulation: All assessment and education complete, DC therapy    Frequency     Barriers to discharge        Co-evaluation               AM-PAC PT "6 Clicks" Mobility  Outcome Measure Help needed turning from your back to your side while in a flat bed without using bedrails?: None Help needed moving from lying on your back to sitting on the side of a flat bed without using bedrails?: None Help needed moving to and from a bed to a chair (including a wheelchair)?: None Help needed standing up from a chair using your arms (e.g., wheelchair or bedside chair)?: None Help needed to walk in hospital room?: A Little Help needed climbing 3-5 steps with a railing? : A Little 6 Click Score: 22    End of Session Equipment Utilized During Treatment: Gait belt;Back brace Activity Tolerance: Patient tolerated treatment well Patient left: in chair;with call bell/phone within reach Nurse Communication: Mobility status PT Visit Diagnosis: Unsteadiness on feet (R26.81);Pain Pain - part of body:  (back)    Time: 4332-9518 PT Time Calculation (min) (ACUTE ONLY): 23 min   Charges:   PT Evaluation $PT Eval Low Complexity: 1 Low PT Treatments $Gait Training: 8-22 mins        Doris Jones, PT, DPT Acute Rehabilitation Services Pager: [phone] Office: [phone]   Doris Jones 07/14/2021, 1:10 PM

## 2021-07-14 NOTE — Progress Notes (Signed)
Patient was transported via wheelchair by volunteer for discharge home; in no acute distress nor complaints of pain nor discomfort; room was checked and accounted for all belongings; discharge instructions was given to patient by RN and she verbalized understanding on the instructions given.

## 2021-07-14 NOTE — Plan of Care (Signed)
  Problem: Safety: Goal: Ability to remain free from injury will improve Outcome: Progressing   Problem: Education: Goal: Ability to verbalize activity precautions or restrictions will improve Outcome: Progressing Goal: Understanding of discharge needs will improve Outcome: Progressing   Problem: Activity: Goal: Ability to avoid complications of mobility impairment will improve Outcome: Progressing Goal: Ability to tolerate increased activity will improve Outcome: Progressing

## 2021-07-14 NOTE — Evaluation (Signed)
Occupational Therapy Evaluation/Discharge  Patient Details Name: Doris Jones MRN: [id number] DOB: Sep 01, 1966 Today's Date: 07/14/2021    History of Present Illness 55 y.o. female s/p L sided L4/5 transforaminal Lumbar interbody fusion with instrumentation and allograft. PMH significant for arthritis, asthma, and HTN.   Clinical Impression   PTA, pt lived with her daughter and was independent and working. Pt performing UB ADLs with set-up and lower body ADL with min Guard A. Pt donning brace with Min A to reach shoulder strap. Performing functional mobility with Min Guard A for safety. Pt educated and demonstrating use of compensatory techniques for LB dressing, brace application, oral care, tub transfers, and bed mobility. All questions answered. Recommend discharge home with no follow up OT. Re-consult if change in status. OT to sign off.     Follow Up Recommendations  No OT follow up    Equipment Recommendations  None recommended by OT    Recommendations for Other Services       Precautions / Restrictions Precautions Precautions: Back Precaution Booklet Issued: Yes (comment) Precaution Comments: All education reviewed, pt verbalizing understanding. Required Braces or Orthoses: Spinal Brace Spinal Brace: Thoracolumbosacral orthotic Restrictions Weight Bearing Restrictions: No      Mobility Bed Mobility Overal bed mobility: Needs Assistance Bed Mobility: Rolling;Sidelying to Sit;Sit to Sidelying Rolling: Modified independent (Device/Increase time) Sidelying to sit: Supervision     Sit to sidelying: Supervision General bed mobility comments: supervision for safety    Transfers Overall transfer level: Needs assistance Equipment used: None Transfers: Sit to/from Stand Sit to Stand: Min guard         General transfer comment: Min Guard A for safety    Balance Overall balance assessment: Mild deficits observed, not formally tested                                          ADL either performed or assessed with clinical judgement   ADL Overall ADL's : Needs assistance/impaired Eating/Feeding: Set up;Sitting   Grooming: Supervision/safety;Standing   Upper Body Bathing: Set up;Sitting   Lower Body Bathing: Sit to/from stand;Min guard   Upper Body Dressing : Set up;Sitting;Minimal assistance Upper Body Dressing Details (indicate cue type and reason): set up for donning shirt, min A for brace application to bring second shoulder strap aver shoulder Lower Body Dressing: Min guard;Sit to/from stand   Toilet Transfer: Min guard;Ambulation;RW;Regular Social worker and Hygiene: Min guard;Sit to/from stand   Tub/ Shower Transfer: Min guard;Tub bench   Functional mobility during ADLs: Min guard General ADL Comments: Pt performing UB ADLs with set-up and lower body ADL with min Guard A. Pt donning brace with Min A. Pt educated and demonstrating use of compensatory techniques for LB dressing, brace application, oral care, tub transfers, and bed mobility.     Vision   Vision Assessment?: No apparent visual deficits     Perception Perception Perception Tested?: No Comments: no apparent difficulty with perception   Praxis Praxis Praxis tested?: Not tested Praxis-Other Comments: no apparent difficulty with motor planning.    Pertinent Vitals/Pain Pain Assessment: Faces Faces Pain Scale: Hurts even more Pain Location: operative site; pt with tearfulness at times Pain Descriptors / Indicators: Discomfort;Operative site guarding;Sore Pain Intervention(s): Limited activity within patient's tolerance;Monitored during session;Repositioned     Hand Dominance Right   Extremity/Trunk Assessment Upper Extremity Assessment Upper Extremity Assessment: Generalized weakness  Lower Extremity Assessment Lower Extremity Assessment: Defer to PT evaluation   Cervical / Trunk Assessment Cervical / Trunk  Assessment: Other exceptions Cervical / Trunk Exceptions: s/p surgery   Communication Communication Communication: No difficulties   Cognition Arousal/Alertness: Awake/alert Behavior During Therapy: WFL for tasks assessed/performed Overall Cognitive Status: Within Functional Limits for tasks assessed                                 General Comments: Pt verbalizing understanding of all education provided. Pt pleasant and conversational throughout session.   General Comments  Pt reporting daughter available to assist with brace application following discharge.    Exercises     Shoulder Instructions      Home Living Family/patient expects to be discharged to:: Private residence Living Arrangements: Other relatives Available Help at Discharge: Family Type of Home: Apartment Home Access: Stairs to enter Secretary/administrator of Steps: 15   Home Layout: One level     Bathroom Shower/Tub: Chief Strategy Officer: Standard     Home Equipment: Tub bench   Additional Comments: Pt lives with daughter who works for [employer]      Prior Functioning/Environment Level of Independence: Independent        Comments: PTA, pt was independent and worked as a Contractor Problem List: Decreased strength;Decreased activity tolerance;Impaired balance (sitting and/or standing);Decreased knowledge of precautions;Pain      OT Treatment/Interventions:      OT Goals(Current goals can be found in the care plan section) Acute Rehab OT Goals Patient Stated Goal: no pain OT Goal Formulation: With patient  OT Frequency:     Barriers to D/C:            Co-evaluation              AM-PAC OT "6 Clicks" Daily Activity     Outcome Measure Help from another person eating meals?: A Little Help from another person taking care of personal grooming?: A Little Help from another person toileting, which includes using toliet, bedpan, or urinal?: A Little Help from  another person bathing (including washing, rinsing, drying)?: A Little Help from another person to put on and taking off regular upper body clothing?: A Little Help from another person to put on and taking off regular lower body clothing?: A Little 6 Click Score: 18   End of Session Equipment Utilized During Treatment: Rolling walker;Back brace Nurse Communication: Mobility status  Activity Tolerance: Patient tolerated treatment well Patient left: in bed;with call bell/phone within reach  OT Visit Diagnosis: Unsteadiness on feet (R26.81);Muscle weakness (generalized) (M62.81);Pain Pain - part of body:  (operative site)                Time: 9381-8299 OT Time Calculation (min): 20 min Charges:  OT General Charges $OT Visit: 1 Visit OT Evaluation $OT Eval Low Complexity: 1 Low Ladene Artist, OTDS   Ladene Artist 07/14/2021, 9:20 AM

## 2021-07-14 NOTE — Progress Notes (Addendum)
    Patient doing well postop day 1.  They report resolution of preoperative leg pain and expected but well-controlled postoperative low back pain.  She is eager to progress her activities. She has not yet been up with physical therapy. She has been up and walking. She did have a rough night but is feeling much better now.   Physical Exam: Vitals:   07/14/21 0516 07/14/21 0749  BP: 110/60 120/75  Pulse: (!) 106 87  Resp: 18 16  Temp: 99.4 F (37.4 C) 98.7 F (37.1 C)  SpO2: 93% 97%    Dressing in place, CDI, pt looks very comfortable and is sitting up in bed,NVI  POD #1 s/p Lumbar decompression and fusion with resolved pre-op leg pain and expected, controlled moderate PO LBP  - up with PT/OT, encourage ambulation - Percocet for pain, Robaxin for muscle spasms - likely d/c home today with f/u in 2 weeks

## 2021-07-20 NOTE — Discharge Summary (Signed)
Patient ID: Doris Jones MRN: [id number] DOB/AGE: 01-31-1966 55 y.o.  Admit date: 07/13/2021 Discharge date: 07/14/2021  Admission Diagnoses:  Active Problems:   Radiculopathy   Discharge Diagnoses:  Same  Past Medical History:  Diagnosis Date   Arthritis    right knee and back per patient   Asthma    HTN (hypertension) 10/24/2015   Hypertension    PONV (postoperative nausea and vomiting)     Surgeries: Procedure(s): LEFT-SIDED LUMBAR FOUR- LUMBAR FIVE TRANSFORAMINAL LUMBAR INTERBODY FUSION WITH INSTRUMENTATION AND ALLOGRAFT on 07/13/2021   Consultants: None  Discharged Condition: Improved  Hospital Course: Doris Jones is an 55 y.o. female who was admitted 07/13/2021 for operative treatment of radiculopathy. Patient has severe unremitting pain that affects sleep, daily activities, and work/hobbies. After pre-op clearance the patient was taken to the operating room on 07/13/2021 and underwent  Procedure(s): LEFT-SIDED LUMBAR FOUR- LUMBAR FIVE TRANSFORAMINAL LUMBAR INTERBODY FUSION WITH INSTRUMENTATION AND ALLOGRAFT.    Patient was given perioperative antibiotics:  Anti-infectives (From admission, onward)    Start     Dose/Rate Route Frequency Ordered Stop   07/13/21 1900  ceFAZolin (ANCEF) IVPB 2g/100 mL premix        2 g 200 mL/hr over 30 Minutes Intravenous Every 8 hours 07/13/21 1742 07/14/21 0308   07/13/21 0930  ceFAZolin (ANCEF) IVPB 2g/100 mL premix        2 g 200 mL/hr over 30 Minutes Intravenous On call to O.R. 07/13/21 0924 07/13/21 1122        Patient was given sequential compression devices, early ambulation to prevent DVT.  Patient benefited maximally from hospital stay and there were no complications.    Recent vital signs: BP 120/75 (BP Location: Right Arm)   Pulse 87   Temp 98.7 F (37.1 C) (Oral)   Resp 16   Ht 5\' 2"  (1.575 m)   Wt 83.2 kg   SpO2 97%   BMI 33.55 kg/m    Discharge Medications:   Allergies as of 07/14/2021        Reactions   Ace Inhibitors Cough   Latex Rash        Medication List     TAKE these medications    amLODipine 5 MG tablet Commonly known as: NORVASC Take 5 mg by mouth daily.   BIOTIN PO Take 1 tablet by mouth daily.   chlorpheniramine-HYDROcodone 10-8 MG/5ML Suer Commonly known as: TUSSIONEX Take 5 mLs by mouth 2 (two) times daily.   diclofenac Sodium 1 % Gel Commonly known as: VOLTAREN Apply 2 g topically daily as needed (knee pain).   HYDROcodone-homatropine 5-1.5 MG/5ML syrup Commonly known as: HYCODAN Take 5 mLs by mouth every 6 (six) hours as needed for cough.   ipratropium-albuterol 0.5-2.5 (3) MG/3ML Soln Commonly known as: DuoNeb Take 3 mLs by nebulization every 6 (six) hours as needed (wheezing).   methocarbamol 500 MG tablet Commonly known as: ROBAXIN Take 1 tablet (500 mg total) by mouth every 6 (six) hours as needed for muscle spasms.   Multivitamin Adult Tabs Take 1 tablet by mouth daily.   oxyCODONE-acetaminophen 5-325 MG tablet Commonly known as: PERCOCET/ROXICET Take 1-2 tablets by mouth every 4 (four) hours as needed for severe pain.   phentermine 37.5 MG tablet Commonly known as: ADIPEX-P Take 37.5 mg by mouth daily.   valsartan-hydrochlorothiazide 160-25 MG tablet Commonly known as: DIOVAN-HCT Take 1 tablet by mouth daily.   VITAMIN D3 PO Take 1 tablet by mouth daily.  ASK your doctor about these medications    benzonatate 100 MG capsule Commonly known as: TESSALON Take 1 capsule (100 mg total) by mouth every 8 (eight) hours.        Diagnostic Studies: DG Lumbar Spine 2-3 Views  Result Date: 07/13/2021 CLINICAL DATA:  Elective surgery Z41.9 (ICD-10-CM). Additional history provided by technologist: L4-5 TLIF. Provided fluoroscopy time 48 seconds (46.31 mGy). EXAM: LUMBAR SPINE - 2-3 VIEW; DG C-ARM 1-60 MIN COMPARISON:  Intraoperative localizer radiographs of the lumbar spine performed earlier today 07/13/2021. Outside  lumbar spine MRI 04/07/2021 (images available, report unavailable). FINDINGS: AP and lateral view intraoperative fluoroscopic images of the lumbosacral spine are submitted, 2 images total. Spinal numbering will remain consistent with that utilized on the intraoperative radiographs performed earlier today. By this spinal numbering scheme, the lowest well-formed interval disc space is designated L5-S1, and there is a rudimentary disc space at S1-S2. On the provided images, a posterior spinal fusion construct is present at L4-L5 (bilateral pedicle screws and vertical interconnecting rods). Interbody device also present at L4-L5. Curvilinear hyperdensity, likely reflecting surgical sponges/packing material, projects over the lumbar spine more cephalad. IMPRESSION: Two intraoperative fluoroscopic images of the lumbosacral spine from L4-L5 posterior fusion, as described. Curvilinear hyperdensity, likely reflecting surgical sponges/packing material, projects over the lumbar spine more cephalad. Correlate with the procedural history. Electronically Signed   By: Jackey Loge DO   On: 07/13/2021 14:46   DG Lumbar Spine 1 View  Result Date: 07/13/2021 CLINICAL DATA:  L4-5 posterior fusion. EXAM: LUMBAR SPINE - 1 VIEW COMPARISON:  None. FINDINGS: Single intraoperative cross-table lateral projection was obtained of the lumbar spine. This image demonstrates 1 surgical probe at approximately the L4-5 level, with another at approximately the S1-2 level. IMPRESSION: Surgical localization as described above. Electronically Signed   By: Lupita Raider M.D.   On: 07/13/2021 14:34   DG C-Arm 1-60 Min  Result Date: 07/13/2021 CLINICAL DATA:  Elective surgery Z41.9 (ICD-10-CM). Additional history provided by technologist: L4-5 TLIF. Provided fluoroscopy time 48 seconds (46.31 mGy). EXAM: LUMBAR SPINE - 2-3 VIEW; DG C-ARM 1-60 MIN COMPARISON:  Intraoperative localizer radiographs of the lumbar spine performed earlier today  07/13/2021. Outside lumbar spine MRI 04/07/2021 (images available, report unavailable). FINDINGS: AP and lateral view intraoperative fluoroscopic images of the lumbosacral spine are submitted, 2 images total. Spinal numbering will remain consistent with that utilized on the intraoperative radiographs performed earlier today. By this spinal numbering scheme, the lowest well-formed interval disc space is designated L5-S1, and there is a rudimentary disc space at S1-S2. On the provided images, a posterior spinal fusion construct is present at L4-L5 (bilateral pedicle screws and vertical interconnecting rods). Interbody device also present at L4-L5. Curvilinear hyperdensity, likely reflecting surgical sponges/packing material, projects over the lumbar spine more cephalad. IMPRESSION: Two intraoperative fluoroscopic images of the lumbosacral spine from L4-L5 posterior fusion, as described. Curvilinear hyperdensity, likely reflecting surgical sponges/packing material, projects over the lumbar spine more cephalad. Correlate with the procedural history. Electronically Signed   By: Jackey Loge DO   On: 07/13/2021 14:46    Disposition: Discharge disposition: 01-Home or Self Care       Discharge Instructions     Discharge patient   Complete by: As directed    Discharge disposition: 01-Home or Self Care   Discharge patient date: 07/14/2021      POD #1 s/p Lumbar decompression and fusion with resolved pre-op leg pain and expected, controlled moderate PO LBP   - up  with PT/OT, encourage ambulation - Percocet for pain, Robaxin for muscle spasms -Scripts for pain sent to pharmacy electronically  -D/C instructions sheet printed and in chart -D/C today  -F/U in office 2 weeks   Signed: Eilene Ghazi Cian Costanzo 07/20/2021, 10:27 AM

## 2021-08-28 ENCOUNTER — Other Ambulatory Visit: Payer: Self-pay | Admitting: Orthopedic Surgery

## 2021-08-30 ENCOUNTER — Other Ambulatory Visit: Payer: Self-pay | Admitting: Orthopedic Surgery

## 2021-08-30 DIAGNOSIS — M545 Low back pain, unspecified: Secondary | ICD-10-CM

## 2021-09-15 ENCOUNTER — Ambulatory Visit
Admission: RE | Admit: 2021-09-15 | Discharge: 2021-09-15 | Disposition: A | Discharge status: Home / Self Care | Payer: BC Managed Care – PPO | Source: Ambulatory Visit | Attending: Orthopedic Surgery | Admitting: Orthopedic Surgery

## 2021-09-15 DIAGNOSIS — M545 Low back pain, unspecified: Secondary | ICD-10-CM

## 2021-09-15 MED ORDER — GADOBENATE DIMEGLUMINE 529 MG/ML IV SOLN
18.0000 mL | Freq: Once | INTRAVENOUS | Status: AC | PRN
  Administered 2021-09-15: 18 mL via INTRAVENOUS

## 2022-12-05 ENCOUNTER — Emergency Department (HOSPITAL_BASED_OUTPATIENT_CLINIC_OR_DEPARTMENT_OTHER)
Admission: EM | Admit: 2022-12-05 | Discharge: 2022-12-05 | Disposition: A | Discharge status: Home / Self Care | Payer: BLUE CROSS/BLUE SHIELD | Attending: Emergency Medicine | Admitting: Emergency Medicine

## 2022-12-05 ENCOUNTER — Emergency Department (HOSPITAL_BASED_OUTPATIENT_CLINIC_OR_DEPARTMENT_OTHER): Payer: BLUE CROSS/BLUE SHIELD | Admitting: Radiology

## 2022-12-05 ENCOUNTER — Encounter (HOSPITAL_BASED_OUTPATIENT_CLINIC_OR_DEPARTMENT_OTHER): Payer: Self-pay

## 2022-12-05 ENCOUNTER — Other Ambulatory Visit: Payer: Self-pay

## 2022-12-05 DIAGNOSIS — J101 Influenza due to other identified influenza virus with other respiratory manifestations: Secondary | ICD-10-CM | POA: Insufficient documentation

## 2022-12-05 DIAGNOSIS — Z1152 Encounter for screening for COVID-19: Secondary | ICD-10-CM | POA: Insufficient documentation

## 2022-12-05 DIAGNOSIS — Z91040 Latex allergy status: Secondary | ICD-10-CM | POA: Insufficient documentation

## 2022-12-05 DIAGNOSIS — R059 Cough, unspecified: Secondary | ICD-10-CM | POA: Diagnosis present

## 2022-12-05 LAB — RESP PANEL BY RT-PCR (RSV, FLU A&B, COVID)  RVPGX2
Influenza A by PCR: NEGATIVE
Influenza B by PCR: POSITIVE — AB
Resp Syncytial Virus by PCR: NEGATIVE
SARS Coronavirus 2 by RT PCR: NEGATIVE

## 2022-12-05 LAB — GROUP A STREP BY PCR: Group A Strep by PCR: NOT DETECTED

## 2022-12-05 MED ORDER — OSELTAMIVIR PHOSPHATE 75 MG PO CAPS: 75.0000 mg | ORAL_CAPSULE | Freq: Two times a day (BID) | ORAL | 0 refills | Status: DC

## 2022-12-05 MED ORDER — HYDROCODONE BIT-HOMATROP MBR 5-1.5 MG/5ML PO SOLN: 5.0000 mL | Freq: Four times a day (QID) | ORAL | 0 refills | Status: AC | PRN

## 2022-12-05 MED ORDER — OSELTAMIVIR PHOSPHATE 75 MG PO CAPS: 75.0000 mg | ORAL_CAPSULE | Freq: Two times a day (BID) | ORAL | 0 refills | Status: AC

## 2022-12-05 MED ORDER — BENZONATATE 100 MG PO CAPS: 100.0000 mg | ORAL_CAPSULE | Freq: Three times a day (TID) | ORAL | 0 refills | Status: DC

## 2022-12-05 MED ORDER — ALBUTEROL SULFATE HFA 108 (90 BASE) MCG/ACT IN AERS
2.0000 | INHALATION_SPRAY | Freq: Once | RESPIRATORY_TRACT | Status: AC
  Administered 2022-12-05: 2 via RESPIRATORY_TRACT
  Filled 2022-12-05: qty 6.7

## 2022-12-05 NOTE — ED Triage Notes (Signed)
Patient here POV from Home.  Endorses Congestion, Cough that began 2 Days ago. Consistent since it began.  No Known Fevers but some Chills.   NAD Noted during Triage. A&O4. GCS 15. Ambulatory.

## 2022-12-05 NOTE — Discharge Instructions (Addendum)
It was a pleasure taking care of you today!   Your COVID and RSV swabs were negative today. Your chest xray didn't show concerning findings for pneumonia today. Your flu swab was positive for Flu B.  You will be sent a prescription for Tamiflu as well as Hycodan, take as directed.  Also you are provided with an albuterol inhaler, you may utilize 2 puffs every 4 hours as needed for your breathing.  You may continue with over the counter cough and cold medications. Ensure to maintain fluid intake. Follow up with your primary care provider regarding todays ED visit. Ensure that you are wearing your mask and practicing good hand hygiene. Return to the ED if you are experiencing increasing/worsening symptoms.

## 2022-12-05 NOTE — ED Provider Notes (Signed)
[facility] EMERGENCY DEPT Provider Note   CSN: [id number] Arrival date & time: 12/05/22  1655     History  Chief Complaint  Patient presents with   Cough    Doris Jones is a 56 y.o. female who presents emergency department with concerns for cough onset 2 days.  Denies sick contacts.  Has associated rhinorrhea, nasal congestion, sore throat.  Tried over-the-counter medications for her symptoms.  Tried her nebulizer at home.  Denies trouble breathing, trouble swallowing.  The history is provided by the patient. No language interpreter was used.       Home Medications Prior to Admission medications   Medication Sig Start Date End Date Taking? Authorizing Provider  HYDROcodone bit-homatropine (HYCODAN) 5-1.5 MG/5ML syrup Take 5 mLs by mouth every 6 (six) hours as needed for cough. 12/05/22  Yes Decorian Schuenemann A, PA-C  amLODipine (NORVASC) 5 MG tablet Take 5 mg by mouth daily. 10/14/15   [provider]  BIOTIN PO Take 1 tablet by mouth daily.    [provider]  chlorpheniramine-HYDROcodone (TUSSIONEX) 10-8 MG/5ML SUER Take 5 mLs by mouth 2 (two) times daily. 10/28/15   Kathlen Mody, MD  Cholecalciferol (VITAMIN D3 PO) Take 1 tablet by mouth daily.    [provider]  diclofenac Sodium (VOLTAREN) 1 % GEL Apply 2 g topically daily as needed (knee pain).    [provider]  HYDROcodone-homatropine (HYCODAN) 5-1.5 MG/5ML syrup Take 5 mLs by mouth every 6 (six) hours as needed for cough. 07/23/18   Gilda Crease, MD  ipratropium-albuterol (DUONEB) 0.5-2.5 (3) MG/3ML SOLN Take 3 mLs by nebulization every 6 (six) hours as needed (wheezing). 07/23/18   Gilda Crease, MD  methocarbamol (ROBAXIN) 500 MG tablet Take 1 tablet (500 mg total) by mouth every 6 (six) hours as needed for muscle spasms. 07/14/21   McKenzie, Eilene Ghazi, PA-C  Multiple Vitamins-Minerals (MULTIVITAMIN ADULT) TABS Take 1 tablet by mouth daily.    [provider]  oseltamivir (TAMIFLU) 75 MG capsule Take 1 capsule (75 mg total) by mouth every 12 (twelve) hours. 12/05/22   Karalynn Cottone A, PA-C  oxyCODONE-acetaminophen (PERCOCET/ROXICET) 5-325 MG tablet Take 1-2 tablets by mouth every 4 (four) hours as needed for severe pain. 07/14/21   McKenzie, Eilene Ghazi, PA-C  phentermine (ADIPEX-P) 37.5 MG tablet Take 37.5 mg by mouth daily. 06/07/21   [provider]  valsartan-hydrochlorothiazide (DIOVAN-HCT) 160-25 MG tablet Take 1 tablet by mouth daily.    [provider]      Allergies    Ace inhibitors and Latex    Review of Systems   Review of Systems  Respiratory:  Positive for cough.   All other systems reviewed and are negative.   Physical Exam Updated Vital Signs BP 107/72   Pulse 91   Temp 98.3 F (36.8 C) (Oral)   Resp 18   Ht 5\' 2"  (1.575 m)   Wt 83.2 kg   SpO2 97%   BMI 33.55 kg/m  Physical Exam Vitals and nursing note reviewed.  Constitutional:      General: She is not in acute distress.    Appearance: She is not diaphoretic.  HENT:     Head: Normocephalic and atraumatic.     Mouth/Throat:     Mouth: Mucous membranes are moist.     Pharynx: Oropharynx is clear. Uvula midline. No oropharyngeal exudate, posterior oropharyngeal erythema or uvula swelling.     Tonsils: No tonsillar exudate.     Comments:  Uvula midline without swelling. No posterior pharyngeal erythema or tonsillar exudate noted. Patent airway. Pt able to speak in clear complete sentences. Tolerating oral secretions. Eyes:     General: No scleral icterus.    Conjunctiva/sclera: Conjunctivae normal.  Cardiovascular:     Rate and Rhythm: Normal rate and regular rhythm.     Pulses: Normal pulses.     Heart sounds: Normal heart sounds.  Pulmonary:     Effort: Pulmonary effort is normal. No respiratory distress.     Breath sounds: Normal breath sounds. No wheezing.  Abdominal:     General: Bowel sounds are normal.     Palpations:  Abdomen is soft. There is no mass.     Tenderness: There is no abdominal tenderness. There is no guarding or rebound.  Musculoskeletal:        General: Normal range of motion.     Cervical back: Normal range of motion and neck supple.  Skin:    General: Skin is warm and dry.  Neurological:     Mental Status: She is alert.  Psychiatric:        Behavior: Behavior normal.     ED Results / Procedures / Treatments   Labs (all labs ordered are listed, but only abnormal results are displayed) Labs Reviewed  RESP PANEL BY RT-PCR (RSV, FLU A&B, COVID)  [id number] - Abnormal; Notable for the following components:      Result Value   Influenza B by PCR POSITIVE (*)    All other components within normal limits  GROUP A STREP BY PCR    EKG None  Radiology DG Chest 2 View  Result Date: 12/05/2022 CLINICAL DATA:  Cough EXAM: CHEST - 2 VIEW COMPARISON:  02/09/2019 FINDINGS: The heart size and mediastinal contours are within normal limits. Both lungs are clear. The visualized skeletal structures are unremarkable. IMPRESSION: No active cardiopulmonary disease. Electronically Signed   By: Ernie Avena M.D.   On: 12/05/2022 18:59    Procedures Procedures    Medications Ordered in ED Medications  albuterol (VENTOLIN HFA) 108 (90 Base) MCG/ACT inhaler 2 puff (2 puffs Inhalation Given 12/05/22 1943)    ED Course/ Medical Decision Making/ A&P                           Medical Decision Making Amount and/or Complexity of Data Reviewed Radiology: ordered.  Risk Prescription drug management.   Pt presents with concerns for cough onset 2 days.  Denies sick contacts. Vital signs, patient afebrile. On exam, pt with no acute cardiovascular, respiratory, abdominal exam findings. Differential diagnosis includes COVID, flu, RSV, strep pharyngitis, viral pharyngitis, viral URI with cough, PNA.    Labs:  I ordered, and personally interpreted labs.  The pertinent results include:   Negative  COVID, strep, RSV swab. Flu swab positive for flu B  Imaging: I ordered imaging studies including Chest x-ray I independently visualized and interpreted imaging which showed: no acute findings I agree with the radiologist interpretation  Medications:  I ordered medication including albuterol inhaler for symptom management I have reviewed the patients home medicines and have made adjustments as needed   Disposition: Presentation suspicious for Flu. Doubt COVID, RSV, Strep pharyngitis, or PNA at this time. Discussed with patient regarding use of Tamiflu and discussed thoroughly on its risks and benefits. Follow discussion, patient opted for treatment with Tamiflu at this time. After consideration of the diagnostic results and the patients response to treatment, I  feel that the patient would benefit from Discharge home. Rx for Tamiflu sent. Rx for Hycodan sent. Supportive care measures and strict return precautions discussed with patient at bedside. Pt acknowledges and verbalizes understanding. Pt appears safe for discharge. Follow up as indicated in discharge paperwork.    This chart was dictated using voice recognition software, Dragon. Despite the best efforts of this provider to proofread and correct errors, errors may still occur which can change documentation meaning.   Final Clinical Impression(s) / ED Diagnoses Final diagnoses:  Influenza B    Rx / DC Orders ED Discharge Orders          Ordered    benzonatate (TESSALON) 100 MG capsule  Every 8 hours,   Status:  Discontinued        12/05/22 1932    oseltamivir (TAMIFLU) 75 MG capsule  Every 12 hours,   Status:  Discontinued        12/05/22 1932    HYDROcodone bit-homatropine (HYCODAN) 5-1.5 MG/5ML syrup  Every 6 hours PRN        12/05/22 1948    oseltamivir (TAMIFLU) 75 MG capsule  Every 12 hours        12/05/22 1948              Danaja Lasota A, PA-C 12/05/22 2228    Arby Barrette, MD 12/18/22 [phone]

## 2023-03-18 DIAGNOSIS — Z419 Encounter for procedure for purposes other than remedying health state, unspecified: Secondary | ICD-10-CM | POA: Diagnosis not present

## 2023-04-17 DIAGNOSIS — Z419 Encounter for procedure for purposes other than remedying health state, unspecified: Secondary | ICD-10-CM | POA: Diagnosis not present

## 2023-05-18 DIAGNOSIS — Z419 Encounter for procedure for purposes other than remedying health state, unspecified: Secondary | ICD-10-CM | POA: Diagnosis not present

## 2023-06-17 DIAGNOSIS — Z419 Encounter for procedure for purposes other than remedying health state, unspecified: Secondary | ICD-10-CM | POA: Diagnosis not present

## 2023-07-18 DIAGNOSIS — Z419 Encounter for procedure for purposes other than remedying health state, unspecified: Secondary | ICD-10-CM | POA: Diagnosis not present

## 2023-08-18 DIAGNOSIS — Z419 Encounter for procedure for purposes other than remedying health state, unspecified: Secondary | ICD-10-CM | POA: Diagnosis not present

## 2023-09-02 DIAGNOSIS — Z7689 Persons encountering health services in other specified circumstances: Secondary | ICD-10-CM | POA: Diagnosis not present

## 2023-09-17 DIAGNOSIS — Z419 Encounter for procedure for purposes other than remedying health state, unspecified: Secondary | ICD-10-CM | POA: Diagnosis not present

## 2023-10-18 DIAGNOSIS — Z419 Encounter for procedure for purposes other than remedying health state, unspecified: Secondary | ICD-10-CM | POA: Diagnosis not present

## 2023-11-17 DIAGNOSIS — Z419 Encounter for procedure for purposes other than remedying health state, unspecified: Secondary | ICD-10-CM | POA: Diagnosis not present

## 2023-12-18 DIAGNOSIS — Z419 Encounter for procedure for purposes other than remedying health state, unspecified: Secondary | ICD-10-CM | POA: Diagnosis not present
